# Patient Record
Sex: Female | Born: 1985 | Hispanic: Yes | Marital: Married | Smoking: Never smoker
Health system: Southern US, Community
[De-identification: ages and names within clinical notes are randomized; demographics above are authoritative.]

## PROBLEM LIST (undated history)

## (undated) ENCOUNTER — Inpatient Hospital Stay (HOSPITAL_COMMUNITY): Payer: Self-pay

## (undated) DIAGNOSIS — D219 Benign neoplasm of connective and other soft tissue, unspecified: Secondary | ICD-10-CM

## (undated) HISTORY — PX: BREAST SURGERY: SHX581

## (undated) HISTORY — PX: APPENDECTOMY: SHX54

---

## 2018-09-25 ENCOUNTER — Other Ambulatory Visit: Payer: Self-pay

## 2018-09-25 ENCOUNTER — Emergency Department (HOSPITAL_BASED_OUTPATIENT_CLINIC_OR_DEPARTMENT_OTHER)
Admission: EM | Admit: 2018-09-25 | Discharge: 2018-09-25 | Disposition: A | Discharge status: Home / Self Care | Payer: BLUE CROSS/BLUE SHIELD | Attending: Emergency Medicine | Admitting: Emergency Medicine

## 2018-09-25 ENCOUNTER — Emergency Department (HOSPITAL_BASED_OUTPATIENT_CLINIC_OR_DEPARTMENT_OTHER): Payer: BLUE CROSS/BLUE SHIELD

## 2018-09-25 ENCOUNTER — Encounter (HOSPITAL_BASED_OUTPATIENT_CLINIC_OR_DEPARTMENT_OTHER): Payer: Self-pay | Admitting: *Deleted

## 2018-09-25 DIAGNOSIS — R55 Syncope and collapse: Secondary | ICD-10-CM | POA: Diagnosis present

## 2018-09-25 HISTORY — DX: Benign neoplasm of connective and other soft tissue, unspecified: D21.9

## 2018-09-25 LAB — CBC WITH DIFFERENTIAL/PLATELET
Abs Immature Granulocytes: 0.02 10*3/uL (ref 0.00–0.07)
Basophils Absolute: 0.1 10*3/uL (ref 0.0–0.1)
Basophils Relative: 1 %
Eosinophils Absolute: 0.1 10*3/uL (ref 0.0–0.5)
Eosinophils Relative: 1 %
HEMATOCRIT: 44.1 % (ref 36.0–46.0)
HEMOGLOBIN: 13.8 g/dL (ref 12.0–15.0)
Immature Granulocytes: 0 %
Lymphocytes Relative: 30 %
Lymphs Abs: 2.8 10*3/uL (ref 0.7–4.0)
MCH: 29.4 pg (ref 26.0–34.0)
MCHC: 31.3 g/dL (ref 30.0–36.0)
MCV: 94 fL (ref 80.0–100.0)
MONO ABS: 0.6 10*3/uL (ref 0.1–1.0)
Monocytes Relative: 7 %
Neutro Abs: 6 10*3/uL (ref 1.7–7.7)
Neutrophils Relative %: 61 %
Platelets: 315 10*3/uL (ref 150–400)
RBC: 4.69 MIL/uL (ref 3.87–5.11)
RDW: 13.8 % (ref 11.5–15.5)
WBC: 9.6 10*3/uL (ref 4.0–10.5)
nRBC: 0 % (ref 0.0–0.2)

## 2018-09-25 LAB — BASIC METABOLIC PANEL
Anion gap: 5 (ref 5–15)
BUN: 19 mg/dL (ref 6–20)
CO2: 28 mmol/L (ref 22–32)
Calcium: 9.2 mg/dL (ref 8.9–10.3)
Chloride: 103 mmol/L (ref 98–111)
Creatinine, Ser: 0.99 mg/dL (ref 0.44–1.00)
GFR calc Af Amer: >60 mL/min (ref >60)
GFR calc non Af Amer: >60 mL/min (ref >60)
GLUCOSE: 87 mg/dL (ref 70–99)
Potassium: 3.9 mmol/L (ref 3.5–5.1)
Sodium: 136 mmol/L (ref 135–145)

## 2018-09-25 LAB — URINALYSIS, MICROSCOPIC (REFLEX)

## 2018-09-25 LAB — URINALYSIS, ROUTINE W REFLEX MICROSCOPIC
Bilirubin Urine: NEGATIVE
Glucose, UA: NEGATIVE mg/dL
Ketones, ur: NEGATIVE mg/dL
Leukocytes, UA: NEGATIVE
Nitrite: NEGATIVE
Protein, ur: NEGATIVE mg/dL
Specific Gravity, Urine: <1.005 — ABNORMAL LOW (ref 1.005–1.030)
pH: 6 (ref 5.0–8.0)

## 2018-09-25 LAB — D-DIMER, QUANTITATIVE: D-Dimer, Quant: 0.51 ug/mL-FEU — ABNORMAL HIGH (ref 0.00–0.50)

## 2018-09-25 LAB — PREGNANCY, URINE: PREG TEST UR: NEGATIVE

## 2018-09-25 MED ORDER — SODIUM CHLORIDE 0.9 % IV BOLUS
1000.0000 mL | Freq: Once | INTRAVENOUS | Status: AC
  Administered 2018-09-25: 1000 mL via INTRAVENOUS

## 2018-09-25 MED ORDER — IOPAMIDOL (ISOVUE-370) INJECTION 76%
100.0000 mL | Freq: Once | INTRAVENOUS | Status: AC | PRN
  Administered 2018-09-25: 100 mL via INTRAVENOUS

## 2018-09-25 NOTE — ED Triage Notes (Signed)
Husband states pt "passed out" this am while brushing hair,  'out for only seconds" reports heavy bleeding d/y fibroids x 5 days, sent here by PMD for eval

## 2018-09-25 NOTE — ED Notes (Signed)
Patient transported to CT 

## 2018-09-25 NOTE — Discharge Instructions (Signed)
As we discussed, make sure you are staying hydrated and drinking plenty of fluids.  Make sure you are getting plenty of rest.  Follow-up with your primary care doctor early next week.  Return the emergency department for any repeat syncopal episodes, chest pain, difficulty breathing, vomiting or any other worsening or concerning symptoms.

## 2018-09-25 NOTE — ED Provider Notes (Signed)
[facility] EMERGENCY DEPARTMENT Provider Note   CSN: [id number] Arrival date & time: 09/25/18  1506     History   Chief Complaint Chief Complaint  Patient presents with  . Loss of Consciousness    HPI Latoya Kennedy is a 33 y.o. female with no significant past medical history who presents for evaluation of syncope that occurred earlier today.  Patient reports that she was getting out of bed and went to the bathroom to go brush her hair.  Husband was in the bedroom when he heard patient fall.  He went to go check on patient and reported syncopal episode with LOC that lasted for about 3 to 4 seconds.  Husband reports that patient came to after about a few seconds.  She reports that prior to syncopal episode, she did not have any dizziness, chest pain, lightheadedness.  She does report that she felt slightly lightheaded [place].  She does report that she has been starting her LMP a week ago and noted heavy bleeding.  She does have a history of fibroids and does report that she usually has heavy bleeding.  Patient does report that she recently returned from a 12-hour trip to [place] on 09/14/18.  She has not had any leg swelling.  Patient reports she is on birth control pills.  Patient states that she feels better now but she called her primary care doctor and advised her to go to the emergency department for full evaluation.  She does report that she has some slight head pain from where she fell and landed on her head.  Patient denies any vision changes, numbness/weakness of arms or legs, chest pain, difficulty breathing.  The history is provided by the patient.    Past Medical History:  Diagnosis Date  . Fibroid     There are no active problems to display for this patient.   Past Surgical History:  Procedure Laterality Date  . APPENDECTOMY    . BREAST SURGERY       OB History   No obstetric history on file.      Home Medications    Prior to Admission medications     Not on File    Family History History reviewed. No pertinent family history.  Social History Social History   Tobacco Use  . Smoking status: Never Smoker  . Smokeless tobacco: Never Used  Substance Use Topics  . Alcohol use: Not Currently  . Drug use: Not Currently     Allergies   Patient has no known allergies.   Review of Systems Review of Systems  Constitutional: Negative for fever.  Eyes: Negative for visual disturbance.  Respiratory: Negative for cough and shortness of breath.   Cardiovascular: Negative for chest pain.  Gastrointestinal: Negative for abdominal pain, nausea and vomiting.  Genitourinary: Negative for dysuria and hematuria.  Musculoskeletal: Positive for neck pain.  Neurological: Positive for syncope, light-headedness and headaches. Negative for weakness and numbness.  All other systems reviewed and are negative.    Physical Exam Updated Vital Signs BP 121/67 (BP Location: Right Arm)   Pulse 67   Temp 98.5 F (36.9 C) (Oral)   Resp (!) 22   Ht 5\' 10"  (1.778 m)   Wt 84.8 kg   LMP 09/18/2018   SpO2 100%   BMI 26.83 kg/m   Physical Exam Vitals signs and nursing note reviewed.  Constitutional:      Appearance: Normal appearance. She is well-developed.  HENT:     Head: Normocephalic  and atraumatic.   Eyes:     General: Lids are normal.     Extraocular Movements: Extraocular movements intact.     Conjunctiva/sclera: Conjunctivae normal.     Pupils: Pupils are equal, round, and reactive to light.     Comments: EOMs intact. PERRL.   Neck:     Musculoskeletal: Full passive range of motion without pain. Muscular tenderness present. No spinous process tenderness.      Comments: Full flexion/extension and lateral movement of neck fully intact. No bony midline tenderness.  Muscular tenderness noted to the paraspinal muscles of the cervical region.  No deformities or crepitus.  Cardiovascular:     Rate and Rhythm: Normal rate and regular  rhythm.     Pulses: Normal pulses.          Radial pulses are 2+ on the right side and 2+ on the left side.     Heart sounds: Normal heart sounds. No murmur. No friction rub. No gallop.   Pulmonary:     Effort: Pulmonary effort is normal.     Breath sounds: Normal breath sounds.     Comments: Lungs clear to auscultation bilaterally.  Symmetric chest rise.  No wheezing, rales, rhonchi. Abdominal:     Palpations: Abdomen is soft. Abdomen is not rigid.     Tenderness: There is no abdominal tenderness. There is no guarding.  Musculoskeletal: Normal range of motion.     Comments: Bilateral lower extremities are symmetric in appearance without any overlying warmth, erythema, edema.  Skin:    General: Skin is warm and dry.     Capillary Refill: Capillary refill takes less than 2 seconds.  Neurological:     Mental Status: She is alert and oriented to person, place, and time.     Comments: Cranial nerves III-XII intact Follows commands, Moves all extremities  5/5 strength to BUE and BLE  Sensation intact throughout all major nerve distributions Normal coordination No slurred speech. No facial droop.   Psychiatric:        Speech: Speech normal.      ED Treatments / Results  Labs (all labs ordered are listed, but only abnormal results are displayed) Labs Reviewed  D-DIMER, QUANTITATIVE (NOT AT [facility]) - Abnormal; Notable for the following components:      Result Value   D-Dimer, Quant 0.51 (*)    All other components within normal limits  URINALYSIS, ROUTINE W REFLEX MICROSCOPIC - Abnormal; Notable for the following components:   Specific Gravity, Urine <1.005 (*)    Hgb urine dipstick TRACE (*)    All other components within normal limits  URINALYSIS, MICROSCOPIC (REFLEX) - Abnormal; Notable for the following components:   Bacteria, UA RARE (*)    All other components within normal limits  URINE CULTURE  BASIC METABOLIC PANEL  CBC WITH DIFFERENTIAL/PLATELET  PREGNANCY, URINE     EKG EKG Interpretation  Date/Time:  Thursday September 25 2018 15:35:54 EST Ventricular Rate:  62 PR Interval:    QRS Duration: 87 QT Interval:  411 QTC Calculation: 418 R Axis:   60 Text Interpretation:  Sinus rhythm Borderline short PR interval Confirmed by Gerlene Fee [id number]) on 09/25/2018 4:30:18 PM   Radiology Dg Chest 2 View  Result Date: 09/25/2018 CLINICAL DATA:  Syncope this morning EXAM: CHEST - 2 VIEW COMPARISON:  None FINDINGS: Normal heart size, mediastinal contours, and pulmonary vascularity. Lungs clear. No pleural effusion or pneumothorax. Bones unremarkable. IMPRESSION: Normal exam. Electronically Signed   By: Lavonia Dana  M.D.   On: 09/25/2018 16:12   Ct Head Wo Contrast  Result Date: 09/25/2018 CLINICAL DATA:  Syncopal episode today. Hit top of head. EXAM: CT HEAD WITHOUT CONTRAST TECHNIQUE: Contiguous axial images were obtained from the base of the skull through the vertex without intravenous contrast. COMPARISON:  None. FINDINGS: Brain: There is no evidence of acute infarct, intracranial hemorrhage, mass, midline shift, or extra-axial fluid collection. The ventricles and sulci are normal. Vascular: No hyperdense vessel. Skull: No fracture or focal osseous lesion. Sinuses/Orbits: Mild right anterior ethmoid air cell mucosal thickening. Clear mastoid air cells. Unremarkable orbits. Other: None. IMPRESSION: Negative head CT. Electronically Signed   By: Logan Bores M.D.   On: 09/25/2018 16:16   Ct Angio Chest Pe W And/or Wo Contrast  Result Date: 09/25/2018 CLINICAL DATA:  Syncopal episode today. Elevated D-dimer. EXAM: CT ANGIOGRAPHY CHEST WITH CONTRAST TECHNIQUE: Multidetector CT imaging of the chest was performed using the standard protocol during bolus administration of intravenous contrast. Multiplanar CT image reconstructions and MIPs were obtained to evaluate the vascular anatomy. CONTRAST:  153mL ISOVUE-370 IOPAMIDOL (ISOVUE-370) INJECTION 76% COMPARISON:  Chest  radiographs obtained earlier today. FINDINGS: Cardiovascular: Satisfactory opacification of the pulmonary arteries to the segmental level. No evidence of pulmonary embolism. Normal heart size. No pericardial effusion. Mediastinum/Nodes: No enlarged mediastinal, hilar, or axillary lymph nodes. Thyroid gland, trachea, and esophagus demonstrate no significant findings. Lungs/Pleura: Lungs are clear. No pleural effusion or pneumothorax. Upper Abdomen: Unremarkable. Musculoskeletal: Mild thoracic spine degenerative changes. Bilateral retropectoral breast implants. Review of the MIP images confirms the above findings. IMPRESSION: No pulmonary emboli or acute abnormality. Electronically Signed   By: Claudie Revering M.D.   On: 09/25/2018 17:31    Procedures Procedures (including critical care time)  Medications Ordered in ED Medications  sodium chloride 0.9 % bolus 1,000 mL ( Intravenous Stopped 09/25/18 1655)  iopamidol (ISOVUE-370) 76 % injection 100 mL (100 mLs Intravenous Contrast Given 09/25/18 1649)     Initial Impression / Assessment and Plan / ED Course  I have reviewed the triage vital signs and the nursing notes.  Pertinent labs & imaging results that were available during my care of the patient were reviewed by me and considered in my medical decision making (see chart for details).     33 year old female who presents for evaluation of syncopal episode that occurred earlier today.  Patient reports that she got out of bed and went to go brush her hair and had a syncopal episode.  Husband reports she had a positive LOC for about 5 seconds and then returned back to baseline.  Patient denies any preceding chest pain, dizziness, lightheadedness.  She does report that she has had some heavy vaginal bleeding over the last several days consistent with her menstrual cycle.  Additionally, patient reports she recently had a 12-hour trip to [place].  Patient denies any chest pain, difficulty breathing, leg  swelling. Patient is afebrile, non-toxic appearing, sitting comfortably on examination table. Vital signs reviewed and stable.  No neurodeficits noted on exam.  We will plan to check labs, EKG, chest x-ray.  Additionally, patient reports hitting her head and reports tenderness palpation of the parietal scalp.  Low suspicion for skull fracture or intracranial hemorrhage but will check CT of head.  Additionally, given lack of prodrome and recent 12-hour trip, will check d-dimer.  CBC without any significant leukocytosis or anemia. D-dimer is positive. Will plan for CTA of chest. BMP unremarkable.  UA negative.  Urine pregnancy negative.  Head CT negative.  CTA of chest shows no evidence of acute pulmonary emboli.  No evidence of any acute abnormality.  Discussed results with patient and family.  Patient states feeling fine here in the ED.  She is able to ambulate without any difficulty and does not feel any lightheadedness or dizziness.  Vital signs stable. [facility] syncope rules evaluated.  Patient has no positive findings.  She is low risk for serious outcome. At this time, patient exhibits no emergent life-threatening condition that require further evaluation in ED or admission. Discussed patient with Dr. Sedonia Small who is agreeable. Patient had ample opportunity for questions and discussion. All patient's questions were answered with full understanding. Strict return precautions discussed. Patient expresses understanding and agreement to plan.    Portions of this note were generated with [employer]. Dictation errors may occur despite best attempts at proofreading.    Final Clinical Impressions(s) / ED Diagnoses   Final diagnoses:  Syncope, unspecified syncope type    ED Discharge Orders    None       Desma Mcgregor 09/25/18 1923    Maudie Flakes, MD 09/26/18 2344

## 2018-09-26 LAB — URINE CULTURE: Culture: NO GROWTH

## 2020-08-01 ENCOUNTER — Emergency Department (HOSPITAL_BASED_OUTPATIENT_CLINIC_OR_DEPARTMENT_OTHER)
Admission: EM | Admit: 2020-08-01 | Discharge: 2020-08-01 | Disposition: A | Discharge status: Home / Self Care | Payer: 59 | Attending: Emergency Medicine | Admitting: Emergency Medicine

## 2020-08-01 ENCOUNTER — Other Ambulatory Visit: Payer: Self-pay

## 2020-08-01 ENCOUNTER — Emergency Department (HOSPITAL_BASED_OUTPATIENT_CLINIC_OR_DEPARTMENT_OTHER): Payer: 59

## 2020-08-01 DIAGNOSIS — I82401 Acute embolism and thrombosis of unspecified deep veins of right lower extremity: Secondary | ICD-10-CM | POA: Insufficient documentation

## 2020-08-01 DIAGNOSIS — M79604 Pain in right leg: Secondary | ICD-10-CM | POA: Diagnosis present

## 2020-08-01 DIAGNOSIS — I824Y1 Acute embolism and thrombosis of unspecified deep veins of right proximal lower extremity: Secondary | ICD-10-CM

## 2020-08-01 MED ORDER — APIXABAN (ELIQUIS) EDUCATION KIT FOR DVT/PE PATIENTS: PACK | Freq: Once | Status: AC

## 2020-08-01 MED ORDER — APIXABAN (ELIQUIS) VTE STARTER PACK (10MG AND 5MG): ORAL_TABLET | ORAL | 0 refills | Status: DC

## 2020-08-01 NOTE — ED Provider Notes (Signed)
[facility] EMERGENCY DEPARTMENT Provider Note   CSN: [id number] Arrival date & time: 08/01/20  1153     History Chief Complaint  Patient presents with  . Leg Pain    Latoya Kennedy is a 34 y.o. female. Patient speaks Spanish primarily and was translated using computer device.  HPI Patient presents after being sent in by physical therapy for possible DVT.  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  Beginning on Friday began to have more pain.  Pain goes from the middle proximal thigh down to the ankle.  Also had increased swelling.  States she had been doing well before this.  No chest pain or trouble breathing.  No fevers.  States she has been doing well in physical therapy.  No previous history of blood clots.    Past Medical History:  Diagnosis Date  . Fibroid     There are no problems to display for this patient.   Past Surgical History:  Procedure Laterality Date  . APPENDECTOMY    . BREAST SURGERY       OB History   No obstetric history on file.     No family history on file.  Social History   Tobacco Use  . Smoking status: Never Smoker  . Smokeless tobacco: Never Used  Vaping Use  . Vaping Use: Never used  Substance Use Topics  . Alcohol use: Not Currently  . Drug use: Not Currently    Home Medications Prior to Admission medications   Medication Sig Start Date End Date Taking? Authorizing Provider  APIXABAN (ELIQUIS) VTE STARTER PACK (10MG AND 5MG) Take as directed on package: start with two-24m tablets twice daily for 7 days. On day 8, switch to one-593mtablet twice daily. 08/01/20   PiDavonna BellingMD    Allergies    Patient has no known allergies.  Review of Systems   Review of Systems  Constitutional: Negative for appetite change and fever.  HENT: Negative for congestion.   Respiratory: Negative for shortness of breath.   Cardiovascular: Positive for leg swelling. Negative for chest pain.  Gastrointestinal: Negative  for abdominal pain.  Genitourinary: Negative for flank pain.  Musculoskeletal: Negative for back pain.  Skin: Negative for rash.  Neurological: Negative for weakness.  Psychiatric/Behavioral: Negative for confusion.    Physical Exam Updated Vital Signs BP 108/74 (BP Location: Right Arm)   Pulse 72   Temp 98.5 F (36.9 C) (Oral)   Resp 16   Ht 5' 9" (1.753 m)   Wt 90.7 kg   LMP 07/20/2020   SpO2 100%   BMI 29.53 kg/m   Physical Exam Vitals and nursing note reviewed.  HENT:     Head: Atraumatic.     Mouth/Throat:     Mouth: Mucous membranes are moist.  Eyes:     Pupils: Pupils are equal, round, and reactive to light.  Cardiovascular:     Rate and Rhythm: Regular rhythm.  Pulmonary:     Breath sounds: No stridor. No wheezing or rhonchi.  Chest:     Chest wall: No tenderness.  Abdominal:     Tenderness: There is no abdominal tenderness.  Musculoskeletal:     Cervical back: Neck supple.     Comments: Tenderness on right lower extremity.  Tenderness from medial thigh down to calf.  Knee surgical sites appear to be healing well.  No erythema.  Pulse intact distally.  Skin:    Capillary Refill: Capillary refill takes less than 2  seconds.  Neurological:     Mental Status: She is alert and oriented to person, place, and time.  Psychiatric:        Mood and Affect: Mood normal.     ED Results / Procedures / Treatments   Labs (all labs ordered are listed, but only abnormal results are displayed) Labs Reviewed - No data to display  EKG None  Radiology US Venous Img Lower Unilateral Right  Result Date: 08/01/2020 CLINICAL DATA:  Recent ACL surgery. EXAM: RIGHT LOWER EXTREMITY VENOUS DOPPLER ULTRASOUND TECHNIQUE: Gray-scale sonography with compression, as well as color and duplex ultrasound, were performed to evaluate the deep venous system(s) from the level of the common femoral vein through the popliteal and proximal calf veins. COMPARISON:  None. FINDINGS: VENOUS  There is evidence of thrombus within the right common femoral vein, profundus femoral vein, femoral vein, popliteal vein, posterior tibial vein, peroneal vein and gastrocnemius vein. No evidence of thrombus at the right saphenofemoral junction. Limited views of the contralateral common femoral vein are unremarkable. IMPRESSION: Positive for extensive DVT in the right lower extremity, as detailed above. Findings discussed with Dr. Alvino Chapel via telephone at 2:05 p.m. Electronically Signed   By: Margaretha Sheffield MD   On: 08/01/2020 14:07    Procedures Procedures (including critical care time)  Medications Ordered in ED Medications  apixaban [facility]) Education Kit for DVT/PE patients (has no administration in time range)    ED Course  I have reviewed the triage vital signs and the nursing notes.  Pertinent labs & imaging results that were available during my care of the patient were reviewed by me and considered in my medical decision making (see chart for details).    MDM Rules/Calculators/A&P                          Patient sent in to rule out DVT.  Does have large DVT on the right.  Discussed with radiology.  I cannot necessarily see the proximal aspect of the clot.  No skin changes.  Will start on Eliquis.  Due to large and proximal DVT will have patient follow-up with vascular surgery.  No signs or symptoms of pulmonary embolism at this time.  Will discharge home. Final Clinical Impression(s) / ED Diagnoses Final diagnoses:  Acute deep vein thrombosis (DVT) of proximal vein of right lower extremity ([place])    Rx / DC Orders ED Discharge Orders         Ordered    APIXABAN (ELIQUIS) VTE STARTER PACK (10MG AND 5MG)        08/01/20 1422           Davonna Belling, MD 08/01/20 1430

## 2020-08-01 NOTE — ED Triage Notes (Signed)
Pt had ACL surgery to right leg on 11/1, went to physical therapy today who recommended to do ultrasound. Pt started having pain throughout leg since Friday.

## 2020-08-01 NOTE — Discharge Instructions (Signed)
Call the vascular surgeons about follow-up for your extensive right leg DVT.  Watch for difficulty breathing.

## 2020-11-14 IMAGING — CT CT HEAD W/O CM
3 series · 16 of 47 positions shown, 19 images · non-contrast
Comparison: None.

CLINICAL DATA: Syncopal episode today. Hit top of head.

EXAM:
CT HEAD WITHOUT CONTRAST
TECHNIQUE: Contiguous axial images were obtained from the base of the skull
through the vertex without intravenous contrast.

[Series 2: head wo · axial · 0.47mm/px · z∈[+1148,+1288]mm · 10 of 34 slices shown, 13 images]
[im 3/34  brain]
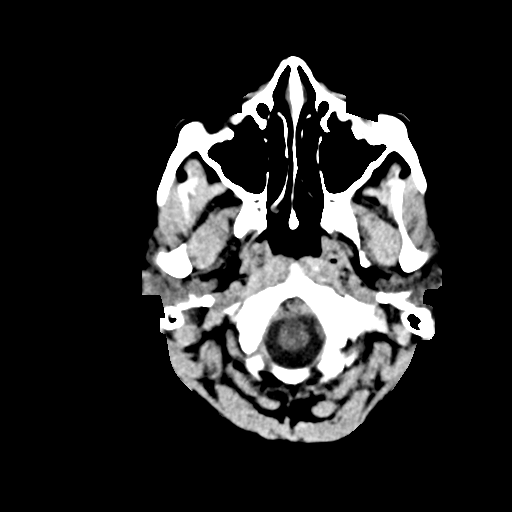
[im 3/34  bone]
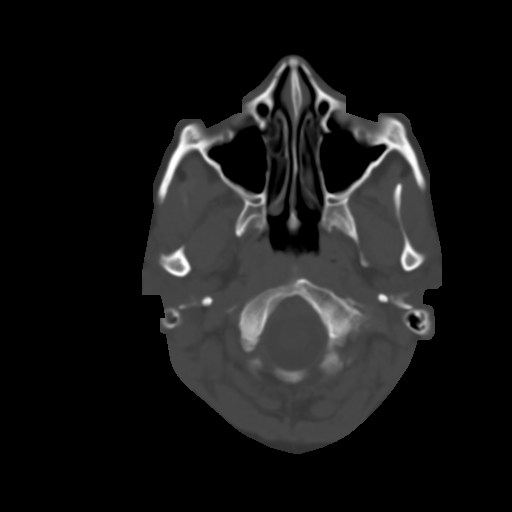
[im 6/34  brain]
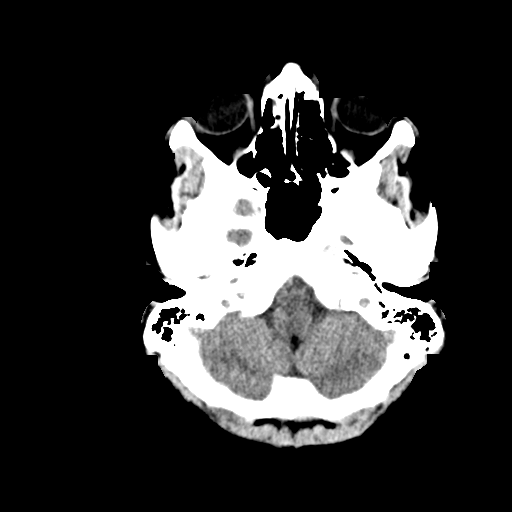
[im 10/34  brain]
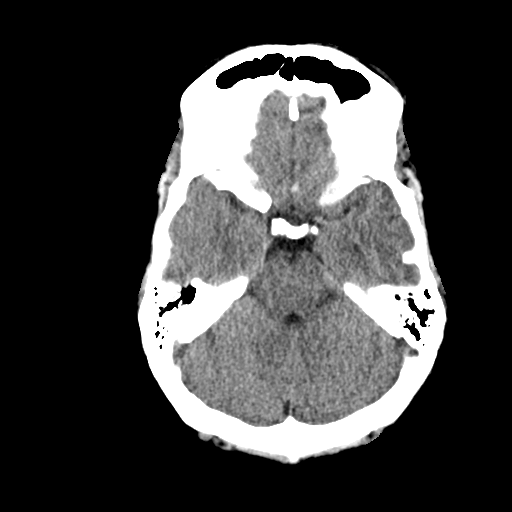
[im 12/34  brain]
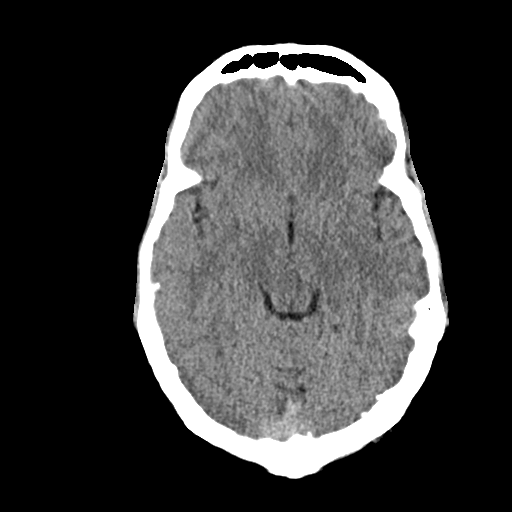
[im 15/34  brain]
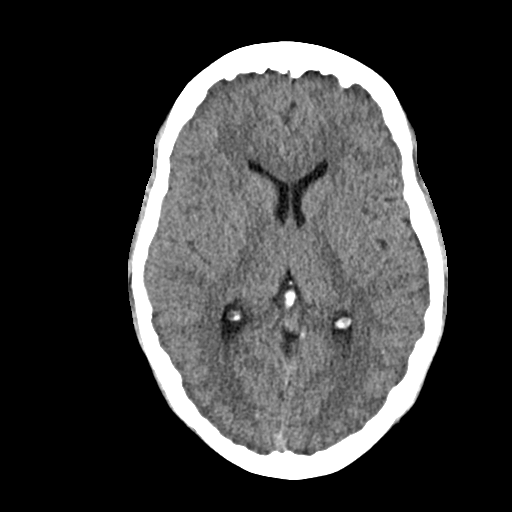
[im 15/34  bone]
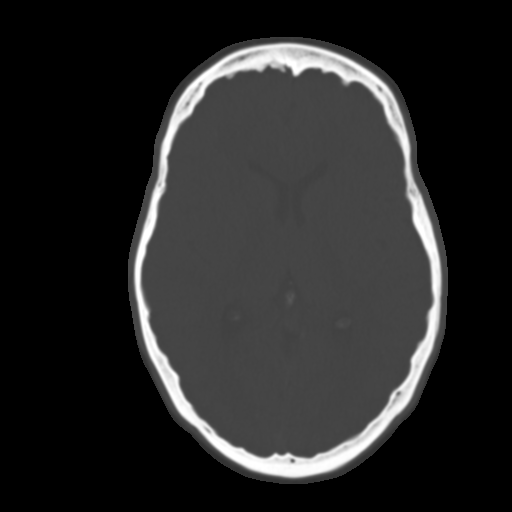
[im 19/34  brain]
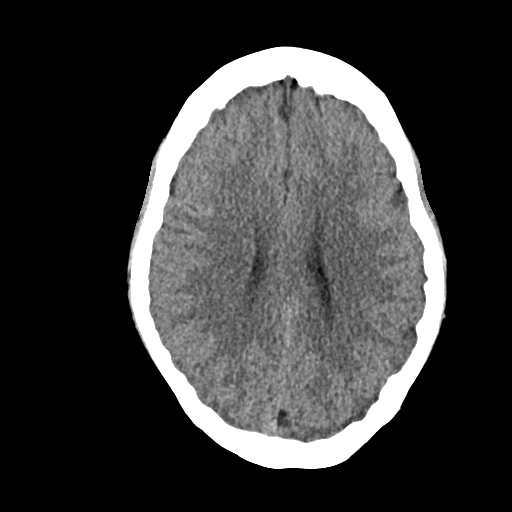
[im 22/34  brain]
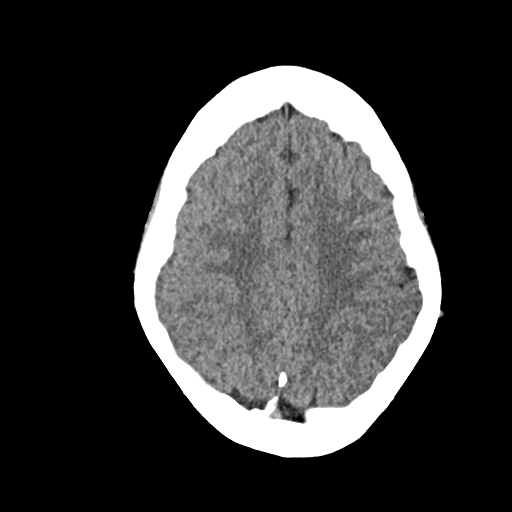
[im 26/34  brain]
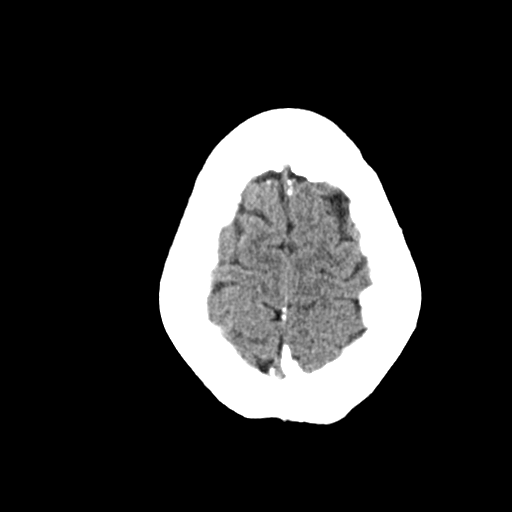
[im 28/34  brain]
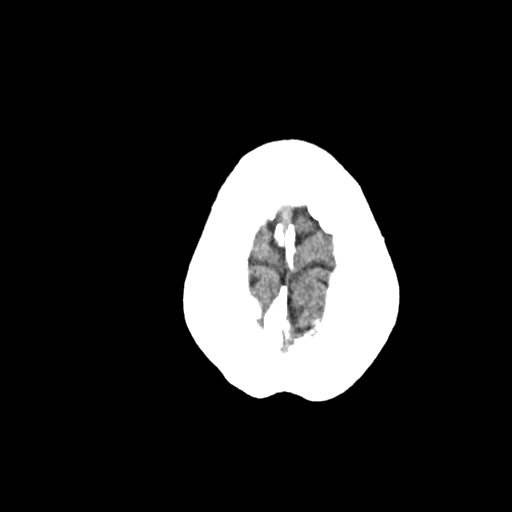
[im 28/34  bone]
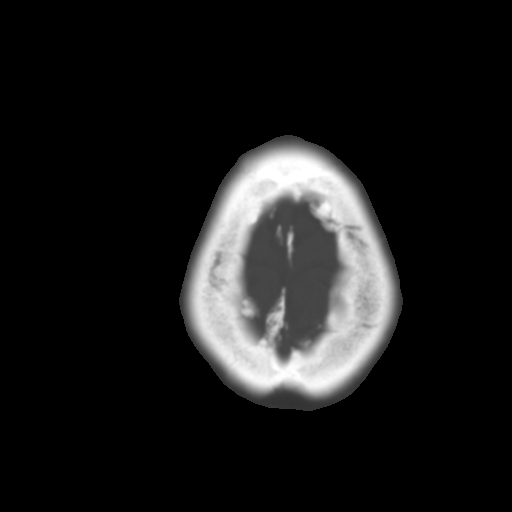
[im 31/34  brain]
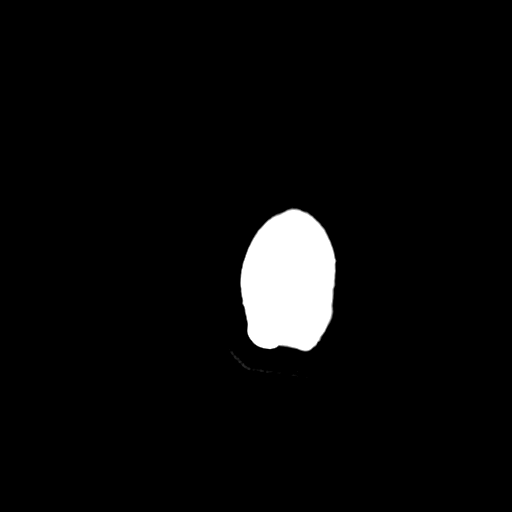

[Series 4: coronal soft · coronal · 0.34mm/px · 3 of 74 slices shown]
[im 25/74  brain]
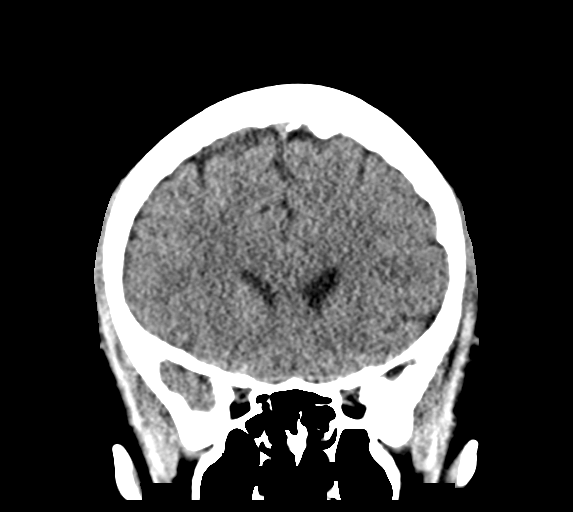
[im 33/74  brain]
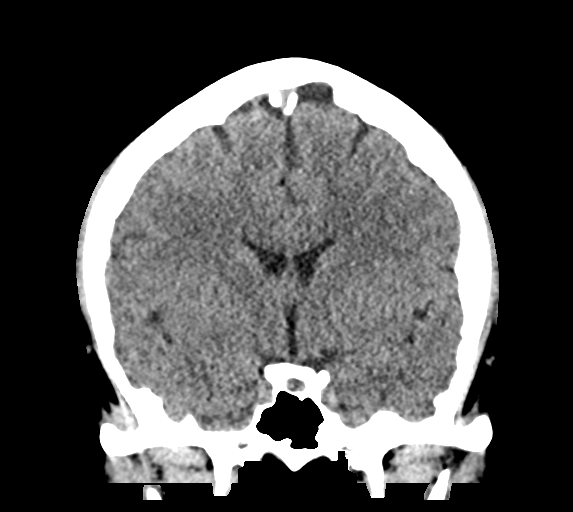
[im 41/74  brain]
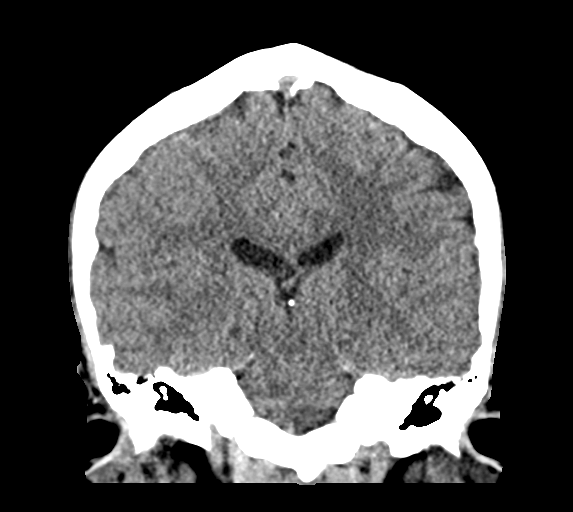

[Series 5: sag soft · sagittal · 0.34mm/px · 3 of 66 slices shown]
[im 22/66  brain]
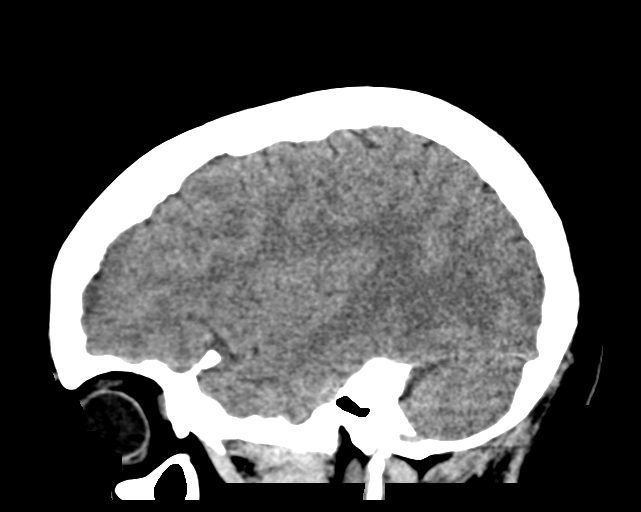
[im 33/66  brain]
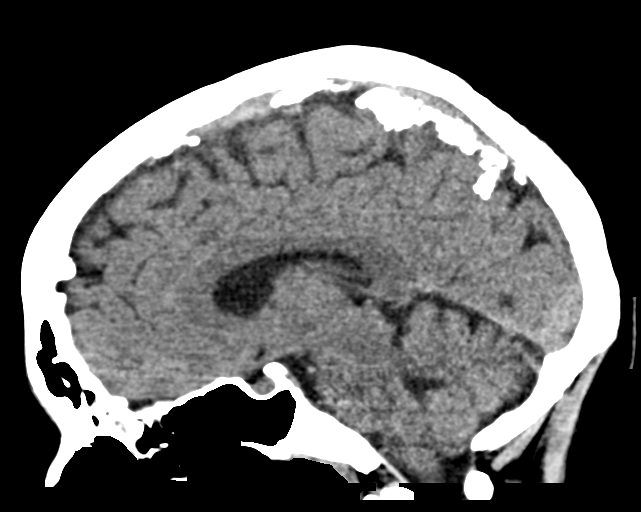
[im 44/66  brain]
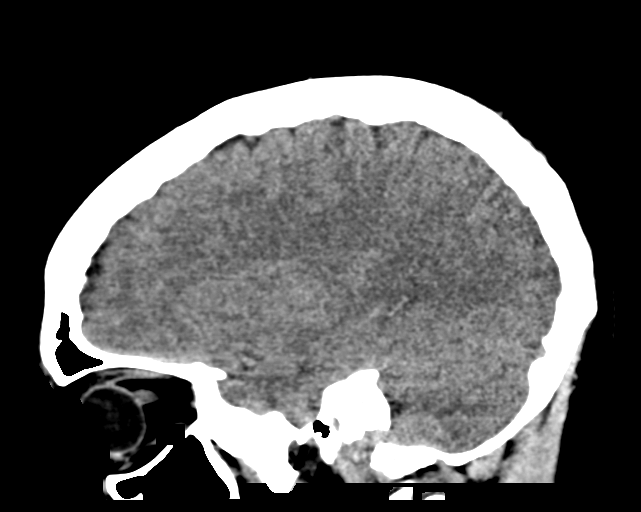

[16 of 47 positions shown; findings below may reference images not displayed]

FINDINGS: Brain: There is no evidence of acute infarct, intracranial
hemorrhage, mass, midline shift, or extra-axial fluid collection.
The ventricles and sulci are normal.

Vascular: No hyperdense vessel.

Skull: No fracture or focal osseous lesion.

Sinuses/Orbits: Mild right anterior ethmoid air cell mucosal
thickening. Clear mastoid air cells. Unremarkable orbits.

Other: None.
IMPRESSION: Negative head CT.

## 2020-11-23 ENCOUNTER — Telehealth: Payer: Self-pay | Admitting: *Deleted

## 2020-11-23 NOTE — Telephone Encounter (Signed)
Called and gave patient's fiance upcoming appointments - mailed calendar with welcome packet

## 2020-12-23 ENCOUNTER — Inpatient Hospital Stay: Payer: 59 | Attending: Family

## 2020-12-23 ENCOUNTER — Other Ambulatory Visit: Payer: Self-pay

## 2020-12-23 ENCOUNTER — Encounter: Payer: Self-pay | Admitting: Family

## 2020-12-23 ENCOUNTER — Inpatient Hospital Stay (HOSPITAL_BASED_OUTPATIENT_CLINIC_OR_DEPARTMENT_OTHER): Payer: 59 | Admitting: Family

## 2020-12-23 ENCOUNTER — Other Ambulatory Visit: Payer: Self-pay | Admitting: Family

## 2020-12-23 VITALS — BP 111/64 | HR 87 | Temp 98.2°F | Resp 16 | Ht 71.0 in | Wt 213.0 lb

## 2020-12-23 DIAGNOSIS — D6859 Other primary thrombophilia: Secondary | ICD-10-CM

## 2020-12-23 DIAGNOSIS — Z7901 Long term (current) use of anticoagulants: Secondary | ICD-10-CM

## 2020-12-23 DIAGNOSIS — I82411 Acute embolism and thrombosis of right femoral vein: Secondary | ICD-10-CM | POA: Diagnosis present

## 2020-12-23 DIAGNOSIS — I82441 Acute embolism and thrombosis of right tibial vein: Secondary | ICD-10-CM

## 2020-12-23 DIAGNOSIS — I824Z9 Acute embolism and thrombosis of unspecified deep veins of unspecified distal lower extremity: Secondary | ICD-10-CM

## 2020-12-23 DIAGNOSIS — I825Y1 Chronic embolism and thrombosis of unspecified deep veins of right proximal lower extremity: Secondary | ICD-10-CM

## 2020-12-23 LAB — CMP (CANCER CENTER ONLY)
ALT: 13 U/L (ref 0–44)
AST: 18 U/L (ref 15–41)
Albumin: 3.9 g/dL (ref 3.5–5.0)
Alkaline Phosphatase: 60 U/L (ref 38–126)
Anion gap: 6 (ref 5–15)
BUN: 13 mg/dL (ref 6–20)
CO2: 28 mmol/L (ref 22–32)
Calcium: 9.6 mg/dL (ref 8.9–10.3)
Chloride: 103 mmol/L (ref 98–111)
Creatinine: 0.84 mg/dL (ref 0.44–1.00)
GFR, Estimated: >60 mL/min (ref >60)
Glucose, Bld: 88 mg/dL (ref 70–99)
Potassium: 3.9 mmol/L (ref 3.5–5.1)
Sodium: 137 mmol/L (ref 135–145)
Total Bilirubin: 0.3 mg/dL (ref 0.3–1.2)
Total Protein: 7.1 g/dL (ref 6.5–8.1)

## 2020-12-23 LAB — CBC WITH DIFFERENTIAL (CANCER CENTER ONLY)
Abs Immature Granulocytes: 0.01 10*3/uL (ref 0.00–0.07)
Basophils Absolute: 0.1 10*3/uL (ref 0.0–0.1)
Basophils Relative: 1 %
Eosinophils Absolute: 0.1 10*3/uL (ref 0.0–0.5)
Eosinophils Relative: 1 %
HCT: 40.1 % (ref 36.0–46.0)
Hemoglobin: 13.2 g/dL (ref 12.0–15.0)
Immature Granulocytes: 0 %
Lymphocytes Relative: 29 %
Lymphs Abs: 2.4 10*3/uL (ref 0.7–4.0)
MCH: 29.4 pg (ref 26.0–34.0)
MCHC: 32.9 g/dL (ref 30.0–36.0)
MCV: 89.3 fL (ref 80.0–100.0)
Monocytes Absolute: 0.7 10*3/uL (ref 0.1–1.0)
Monocytes Relative: 9 %
Neutro Abs: 5 10*3/uL (ref 1.7–7.7)
Neutrophils Relative %: 60 %
Platelet Count: 271 10*3/uL (ref 150–400)
RBC: 4.49 MIL/uL (ref 3.87–5.11)
RDW: 13.9 % (ref 11.5–15.5)
WBC Count: 8.3 10*3/uL (ref 4.0–10.5)
nRBC: 0 % (ref 0.0–0.2)

## 2020-12-23 LAB — D-DIMER, QUANTITATIVE: D-Dimer, Quant: 0.67 ug/mL-FEU — ABNORMAL HIGH (ref 0.00–0.50)

## 2020-12-23 LAB — ANTITHROMBIN III: AntiThromb III Func: 90 % (ref 75–120)

## 2020-12-23 NOTE — Progress Notes (Signed)
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   HISTORY OF PRESENT ILLNESS: Ms. Coatney is a very pleasant 35 yo Hispanic female with diagnosis of extensive DVT in the right lower extremity involving the right common femoral vein profundus femoral vein, femoral vein, popliteal vein, posterior tibial vein, peroneal vein and gastrocnemius vein. No prior history of clot.  This was diagnosed after she had surgery on 07/11/2020 [facility], [facility]) for right knee arthroscopic anterior cruciate ligament reconstruction with hamstring allograft.  She had to cut PT short due to the thrombus and will have to have another surgery for manipulation and lysis of adhesions to improve mobility and range of motion at a later date. She is having stiffness and is not able to completely extend the right leg.  She had developed pain and swelling in the left lag post surgery and was diagnosed with DVT on 08/01/21. She started treatment with Eliquis that day and continues to tolerate well. She states that she is taking the proper dose PO BID as prescribed.  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  No episodes of bleeding. No petechiae. She does bruise easily but not in excess.  She has also remained on Tri Sprintec birth control which contains estrogen. She has been in contact with her PCP and they are working to change over to something only containing progestin. She  is aware that she will need to now avoid estrogen based therapy of any kind due to the increased risk of clot.  She has no history of pregnancy or miscarriage.  She states that she does have history of uterine fibroids. Her cycle is regular with heavy flow at times. She also note cramping with her cycle.  No family history of thrombotic event.  She has had surgery in the past including appendectomy as well as breast augmentations in 2009, without any complications.  No history of diabetes or thyroid disease.  No fever, chills, n/v, cough, rash, dizziness, SOB, chest pain, palpitations, abdominal pain or changes in bowel or bladder habits.  She still has some mild swelling in the right knee. No other swelling noted. No numbness or tingling in her extremities.  No falls to report. She has not had a syncopal episode for around 2 years.  No smoking, ETOh or recreational drug use.  Sh has maintained a good appetite and is staying well hydrated. Her weight is described as stable at 213 lbs.   ROS: All other 10 point review of systems is negative.   PAST MEDICAL HISTORY:   Past Medical History:  Diagnosis Date  . Fibroid     ALLERGIES: No Known Allergies    MEDICATIONS:  Current Outpatient Medications on File Prior to Visit  Medication Sig Dispense Refill  . APIXABAN (ELIQUIS) VTE STARTER PACK (10MG  AND 5MG ) Take as directed on package: start with two-5mg  tablets twice daily for 7 days. On day 8, switch to one-5mg  tablet twice daily. 1 each 0   No  current facility-administered medications on file prior to visit.     PAST SURGICAL HISTORY Past Surgical History:  Procedure Laterality Date  . APPENDECTOMY    . BREAST SURGERY      FAMILY HISTORY: History reviewed. No pertinent family history.  SOCIAL HISTORY:  reports that she has never smoked. She has never used smokeless tobacco. She reports previous alcohol use. She reports previous drug use.  PERFORMANCE STATUS: The patient's  performance status is 1 - Symptomatic but completely ambulatory  PHYSICAL EXAM: Most Recent Vital Signs: Blood pressure 111/64, pulse 87, temperature 98.2 F (36.8 C), resp. rate 16, weight 213 lb (96.6 kg), SpO2 99 %. BP 111/64 (Patient Position: Sitting)   Pulse 87   Temp 98.2 F (36.8 C)   Resp 16   Ht 5\' 11"  (1.803 m)   Wt 213 lb (96.6 kg)   SpO2 99%   BMI 29.71 kg/m   General Appearance:    Alert, cooperative, no distress, appears stated age  Head:    Normocephalic, without obvious abnormality, atraumatic  Eyes:    PERRL, conjunctiva/corneas clear, EOM's intact, fundi    benign, both eyes        Throat:   Lips, mucosa, and tongue normal; teeth and gums normal  Neck:   Supple, symmetrical, trachea midline, no adenopathy;    thyroid:  no enlargement/tenderness/nodules; no carotid   bruit or JVD  Back:     Symmetric, no curvature, ROM normal, no CVA tenderness  Lungs:     Clear to auscultation bilaterally, respirations unlabored  Chest Wall:    No tenderness or deformity   Heart:    Regular rate and rhythm, S1 and S2 normal, no murmur, rub   or gallop     Abdomen:     Soft, non-tender, bowel sounds active all four quadrants,    no masses, no organomegaly        Extremities:   Extremities normal, atraumatic, no cyanosis or edema  Pulses:   2+ and symmetric all extremities  Skin:   Skin color, texture, turgor normal, no rashes or lesions  Lymph nodes:   Cervical, supraclavicular, and axillary nodes normal  Neurologic:   CNII-XII intact, normal strength, sensation and reflexes    throughout    LABORATORY DATA:  Results for orders placed or performed in visit on 12/23/20 (from the past 48 hour(s))  CBC with Differential (Cancer Center Only)     Status: None   Collection Time: 12/23/20  2:35 PM  Result Value Ref Range   WBC Count 8.3 4.0 - 10.5 K/uL   RBC 4.49 3.87 - 5.11 MIL/uL   Hemoglobin 13.2 12.0 - 15.0 g/dL   HCT 40.1 36.0 - 46.0 %   MCV 89.3 80.0 - 100.0 fL    MCH 29.4 26.0 - 34.0 pg   MCHC 32.9 30.0 - 36.0 g/dL   RDW 13.9 11.5 - 15.5 %   Platelet Count 271 150 - 400 K/uL   nRBC 0.0 0.0 - 0.2 %   Neutrophils Relative % 60 %   Neutro Abs 5.0 1.7 - 7.7 K/uL   Lymphocytes Relative 29 %   Lymphs Abs 2.4 0.7 - 4.0 K/uL   Monocytes Relative 9 %   Monocytes Absolute 0.7 0.1 - 1.0 K/uL   Eosinophils Relative 1 %   Eosinophils Absolute 0.1 0.0 - 0.5 K/uL   Basophils Relative 1 %   Basophils Absolute 0.1 0.0 - 0.1 K/uL   Immature Granulocytes 0 %  Abs Immature Granulocytes 0.01 0.00 - 0.07 K/uL    Comment: Performed at [facility] at [facility], [address] [place], [place] [address]      RADIOGRAPHY: No results found.     PATHOLOGY: None  ASSESSMENT/PLAN: Ms. 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 This occurred post surgery and while on estrogen based birth control.  She was diagnosed in November, 2021 and has been on Eliquis since that time.  We will have her stop her estrogen based birth control immediately. She has been in contact with her gynecologist already to change to progesterone based.  Hyper coag panel is pending. We will see if she has any underlying reason to clot.   We will plan to see her again for follow-up in 3 months and repeat an [place] of her right leg again at that time.  We will call her as soon as we have all her lab results and go over.   All questions were answered and she is in agreement with the plan. She can contact our office with any questions or concerns. We can certainly see the patient sooner if needed.   The patient was discussed with Dr. Marin Olp and he is in agreement with the aforementioned.   Laverna Peace, NP

## 2020-12-24 LAB — PROTEIN C ACTIVITY: Protein C Activity: 126 % (ref 73–180)

## 2020-12-24 LAB — LUPUS ANTICOAGULANT PANEL
DRVVT: 32 s (ref 0.0–47.0)
PTT Lupus Anticoagulant: 31.2 s (ref 0.0–51.9)

## 2020-12-24 LAB — PROTEIN S, TOTAL: Protein S Ag, Total: 51 % — ABNORMAL LOW (ref 60–150)

## 2020-12-24 LAB — PROTEIN S ACTIVITY: Protein S Activity: 52 % — ABNORMAL LOW (ref 63–140)

## 2020-12-24 LAB — PROTEIN C, TOTAL: Protein C, Total: 87 % (ref 60–150)

## 2020-12-26 LAB — HOMOCYSTEINE: Homocysteine: 9 umol/L (ref 0.0–14.5)

## 2020-12-26 LAB — BETA-2-GLYCOPROTEIN I ABS, IGG/M/A
Beta-2 Glyco I IgG: <9 GPI IgG units (ref 0–20)
Beta-2-Glycoprotein I IgA: <9 GPI IgA units (ref 0–25)
Beta-2-Glycoprotein I IgM: <9 GPI IgM units (ref 0–32)

## 2020-12-27 LAB — CARDIOLIPIN ANTIBODIES, IGG, IGM, IGA
Anticardiolipin IgA: <9 APL U/mL (ref 0–11)
Anticardiolipin IgG: <9 GPL U/mL (ref 0–14)
Anticardiolipin IgM: <9 MPL U/mL (ref 0–12)

## 2020-12-28 LAB — PROTHROMBIN GENE MUTATION

## 2020-12-29 LAB — FACTOR 5 LEIDEN

## 2020-12-30 ENCOUNTER — Telehealth: Payer: Self-pay | Admitting: Family

## 2020-12-30 DIAGNOSIS — D6859 Other primary thrombophilia: Secondary | ICD-10-CM

## 2020-12-30 DIAGNOSIS — I825Y1 Chronic embolism and thrombosis of unspecified deep veins of right proximal lower extremity: Secondary | ICD-10-CM

## 2020-12-30 NOTE — Telephone Encounter (Signed)
I was able to speak with Ms. Wilma Flavin fiance Costella Hatcher and go over her recent lab work. She had slightly low protein S total and activity which can cause increased risk of thrombus. She has an appointment with a gynecologist to get her off her current estrogen based birth control and onto progesterone based. She has follow-up with repeat lab and [place] on July. No questions at this time. He was appreciative if call.

## 2021-03-24 ENCOUNTER — Other Ambulatory Visit: Payer: Self-pay | Admitting: *Deleted

## 2021-03-24 ENCOUNTER — Inpatient Hospital Stay: Payer: 59 | Attending: Family

## 2021-03-24 ENCOUNTER — Encounter: Payer: Self-pay | Admitting: Family

## 2021-03-24 ENCOUNTER — Inpatient Hospital Stay (HOSPITAL_BASED_OUTPATIENT_CLINIC_OR_DEPARTMENT_OTHER): Payer: 59 | Admitting: Family

## 2021-03-24 ENCOUNTER — Other Ambulatory Visit: Payer: Self-pay

## 2021-03-24 VITALS — BP 107/68 | HR 63 | Temp 98.0°F | Resp 15 | Wt 213.0 lb

## 2021-03-24 DIAGNOSIS — I825Y1 Chronic embolism and thrombosis of unspecified deep veins of right proximal lower extremity: Secondary | ICD-10-CM

## 2021-03-24 DIAGNOSIS — D6859 Other primary thrombophilia: Secondary | ICD-10-CM | POA: Diagnosis not present

## 2021-03-24 DIAGNOSIS — Z7901 Long term (current) use of anticoagulants: Secondary | ICD-10-CM | POA: Insufficient documentation

## 2021-03-24 DIAGNOSIS — I824Z9 Acute embolism and thrombosis of unspecified deep veins of unspecified distal lower extremity: Secondary | ICD-10-CM

## 2021-03-24 DIAGNOSIS — I82531 Chronic embolism and thrombosis of right popliteal vein: Secondary | ICD-10-CM | POA: Insufficient documentation

## 2021-03-24 LAB — CMP (CANCER CENTER ONLY)
ALT: 35 U/L (ref 0–44)
AST: 24 U/L (ref 15–41)
Albumin: 4 g/dL (ref 3.5–5.0)
Alkaline Phosphatase: 70 U/L (ref 38–126)
Anion gap: 3 — ABNORMAL LOW (ref 5–15)
BUN: 14 mg/dL (ref 6–20)
CO2: 25 mmol/L (ref 22–32)
Calcium: 9 mg/dL (ref 8.9–10.3)
Chloride: 108 mmol/L (ref 98–111)
Creatinine: 0.77 mg/dL (ref 0.44–1.00)
GFR, Estimated: >60 mL/min (ref >60)
Glucose, Bld: 104 mg/dL — ABNORMAL HIGH (ref 70–99)
Potassium: 4 mmol/L (ref 3.5–5.1)
Sodium: 136 mmol/L (ref 135–145)
Total Bilirubin: 0.5 mg/dL (ref 0.3–1.2)
Total Protein: 7.3 g/dL (ref 6.5–8.1)

## 2021-03-24 LAB — CBC WITH DIFFERENTIAL (CANCER CENTER ONLY)
Abs Immature Granulocytes: 0.02 10*3/uL (ref 0.00–0.07)
Basophils Absolute: 0.1 10*3/uL (ref 0.0–0.1)
Basophils Relative: 1 %
Eosinophils Absolute: 0.2 10*3/uL (ref 0.0–0.5)
Eosinophils Relative: 2 %
HCT: 41.5 % (ref 36.0–46.0)
Hemoglobin: 13.8 g/dL (ref 12.0–15.0)
Immature Granulocytes: 0 %
Lymphocytes Relative: 35 %
Lymphs Abs: 2.7 10*3/uL (ref 0.7–4.0)
MCH: 29.9 pg (ref 26.0–34.0)
MCHC: 33.3 g/dL (ref 30.0–36.0)
MCV: 89.8 fL (ref 80.0–100.0)
Monocytes Absolute: 0.5 10*3/uL (ref 0.1–1.0)
Monocytes Relative: 7 %
Neutro Abs: 4.3 10*3/uL (ref 1.7–7.7)
Neutrophils Relative %: 55 %
Platelet Count: 262 10*3/uL (ref 150–400)
RBC: 4.62 MIL/uL (ref 3.87–5.11)
RDW: 13.8 % (ref 11.5–15.5)
WBC Count: 7.7 10*3/uL (ref 4.0–10.5)
nRBC: 0 % (ref 0.0–0.2)

## 2021-03-24 NOTE — Progress Notes (Signed)
Hematology and Oncology Follow Up Visit  Latoya Kennedy [id number] [birth year] 35 y.o. 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 She is doing quite well and has no complaints at this time.  She completely stopped her birthcontrol. Her cycle has been a little heavier on anticoagulation. No other blood loss noted. No bruising or petechiae.  No fever, chills, n/v, cough, rash, dizziness, SOB, chest pain, palpitations, abdominal pain or changes in bowel or bladder habits.  No swelling, tenderness, numbness or tingling in her extremities. She is wearing a compression stocking on the right lower extremity for added support.  No falls or syncope to report.  She has a good appetite and is staying well hydrated. Her weight is stable at 213 lbs.   ECOG Performance Status: 1 - Symptomatic but completely ambulatory  Medications:  Allergies as of 03/24/2021   No Known Allergies      Medication List        Accurate as of March 24, 2021 10:28 AM. If you have any questions, ask your nurse or doctor.          Apixaban Starter Pack (10mg  and 5mg ) Commonly known as: ELIQUIS STARTER PACK Take as directed on package: start with two-5mg  tablets twice daily for 7 days. On day 8, switch to one-5mg  tablet twice daily.        Allergies: No Known Allergies  Past Medical History, Surgical history, Social history, and Family History were reviewed and updated.  Review of Systems: All other 10 point review of systems is negative.   Physical Exam:  weight is 213 lb (96.6 kg). Her oral temperature is 98 F (36.7 C). Her blood pressure is 107/68 and her pulse is 63. Her respiration is 15 and oxygen  saturation is 100%.   Wt Readings from Last 3 Encounters:  03/24/21 213 lb (96.6 kg)  12/23/20 213 lb (96.6 kg)  08/01/20 200 lb (90.7 kg)    Ocular: Sclerae unicteric, pupils equal, round and reactive to light Ear-nose-throat: Oropharynx clear, dentition fair Lymphatic: No cervical or supraclavicular adenopathy Lungs no rales or rhonchi, good excursion bilaterally Heart regular rate and rhythm, no murmur appreciated Abd soft, nontender, positive bowel sounds MSK no focal spinal tenderness, no joint edema Neuro: non-focal, well-oriented, appropriate affect Breasts: Deferred   Lab Results  Component Value Date   WBC 7.7 03/24/2021   HGB 13.8 03/24/2021   HCT 41.5 03/24/2021   MCV 89.8 03/24/2021   PLT 262 03/24/2021   No results found for: FERRITIN, IRON, TIBC, UIBC, IRONPCTSAT Lab Results  Component Value Date   RBC 4.62 03/24/2021   No results found for: KPAFRELGTCHN, LAMBDASER, KAPLAMBRATIO No results found for: IGGSERUM, IGA, IGMSERUM No results found for: Ronnald Ramp, A1GS, A2GS, Tillman Sers, SPEI   Chemistry      Component Value Date/Time   NA 136 03/24/2021 0934   K 4.0 03/24/2021 0934   CL 108 03/24/2021 0934   CO2 25 03/24/2021 0934   BUN 14 03/24/2021 0934   CREATININE 0.77 03/24/2021 0934      Component Value Date/Time   CALCIUM 9.0 03/24/2021 0934   ALKPHOS 70 03/24/2021 0934   AST 24 03/24/2021 0934  ALT 35 03/24/2021 0934   BILITOT 0.5 03/24/2021 0934       Impression and Plan: Latoya Kennedy is a very pleasant 35 yo Hispanic female with a now 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 This occurred post surgery and while on estrogen based birth control.  Protein S studies are pending.  She is scheduled for repeat US on 04/06/2021.  She will continue her same regimen with Eliquis.  Follow-up in 4 months.   They can contact our office with any questions or concerns.   Laverna Peace, NP 7/15/202210:28 AM

## 2021-03-27 LAB — PROTEIN S, TOTAL: Protein S Ag, Total: 101 % (ref 60–150)

## 2021-03-27 LAB — PROTEIN S ACTIVITY: Protein S Activity: 88 % (ref 63–140)

## 2021-04-06 ENCOUNTER — Other Ambulatory Visit: Payer: Self-pay

## 2021-04-06 ENCOUNTER — Ambulatory Visit (HOSPITAL_BASED_OUTPATIENT_CLINIC_OR_DEPARTMENT_OTHER)
Admission: RE | Admit: 2021-04-06 | Discharge: 2021-04-06 | Disposition: A | Discharge status: Home / Self Care | Payer: 59 | Source: Ambulatory Visit | Attending: Family | Admitting: Family

## 2021-04-06 DIAGNOSIS — I825Y1 Chronic embolism and thrombosis of unspecified deep veins of right proximal lower extremity: Secondary | ICD-10-CM | POA: Diagnosis present

## 2021-04-07 ENCOUNTER — Encounter: Payer: Self-pay | Admitting: *Deleted

## 2021-04-17 ENCOUNTER — Telehealth: Payer: Self-pay | Admitting: *Deleted

## 2021-04-17 NOTE — Telephone Encounter (Signed)
Message received from patient's fiance wanting information regarding pt.'s Eliquis and [place].  Call placed back to [facility] and [place] notified per order of S. McLendon-Chisholm NP that "your Ultrasound shows improvement in DVT!!! You have some residual clot that is possibly chronic but the veins have blood flow again! This is great news. Continue taking the anticoagulation as prescribed and we will see you again at follow-up."  Latoya Kennedy is appreciative of information and has no further questions at this time.

## 2021-04-20 ENCOUNTER — Encounter: Payer: Self-pay | Admitting: *Deleted

## 2021-04-20 ENCOUNTER — Telehealth: Payer: Self-pay

## 2021-04-20 NOTE — Telephone Encounter (Signed)
R/c to [facility] regarding clearance for Eliquis prior to surgery.  Pt has not been scheduled yet but plans to schedule her knee sx after the office gets clearance and instructions on how to take the Eliquis. Pt will be seeing Dr Sharman Crate and orthopedic surgeon with [facility] in [place], [place]. Their fax # is [phone]. Please send clearance letter to fax number, thank you!

## 2021-04-24 ENCOUNTER — Other Ambulatory Visit: Payer: Self-pay | Admitting: *Deleted

## 2021-04-24 MED ORDER — APIXABAN 5 MG PO TABS: 5.0000 mg | ORAL_TABLET | Freq: Two times a day (BID) | ORAL | 4 refills | Status: DC

## 2021-07-28 ENCOUNTER — Inpatient Hospital Stay: Payer: 59 | Attending: Hematology & Oncology

## 2021-07-28 ENCOUNTER — Other Ambulatory Visit: Payer: Self-pay

## 2021-07-28 ENCOUNTER — Encounter: Payer: Self-pay | Admitting: Family

## 2021-07-28 ENCOUNTER — Inpatient Hospital Stay (HOSPITAL_BASED_OUTPATIENT_CLINIC_OR_DEPARTMENT_OTHER): Payer: 59 | Admitting: Family

## 2021-07-28 VITALS — BP 112/67 | HR 64 | Temp 98.3°F | Resp 18 | Ht 71.0 in | Wt 220.0 lb

## 2021-07-28 DIAGNOSIS — I825Y1 Chronic embolism and thrombosis of unspecified deep veins of right proximal lower extremity: Secondary | ICD-10-CM | POA: Diagnosis not present

## 2021-07-28 DIAGNOSIS — I824Z9 Acute embolism and thrombosis of unspecified deep veins of unspecified distal lower extremity: Secondary | ICD-10-CM

## 2021-07-28 DIAGNOSIS — Z7901 Long term (current) use of anticoagulants: Secondary | ICD-10-CM | POA: Diagnosis not present

## 2021-07-28 DIAGNOSIS — I82531 Chronic embolism and thrombosis of right popliteal vein: Secondary | ICD-10-CM | POA: Insufficient documentation

## 2021-07-28 DIAGNOSIS — D6859 Other primary thrombophilia: Secondary | ICD-10-CM

## 2021-07-28 LAB — CMP (CANCER CENTER ONLY)
ALT: 29 U/L (ref 0–44)
AST: 20 U/L (ref 15–41)
Albumin: 4.2 g/dL (ref 3.5–5.0)
Alkaline Phosphatase: 72 U/L (ref 38–126)
Anion gap: 7 (ref 5–15)
BUN: 19 mg/dL (ref 6–20)
CO2: 27 mmol/L (ref 22–32)
Calcium: 9.7 mg/dL (ref 8.9–10.3)
Chloride: 106 mmol/L (ref 98–111)
Creatinine: 0.93 mg/dL (ref 0.44–1.00)
GFR, Estimated: >60 mL/min (ref >60)
Glucose, Bld: 87 mg/dL (ref 70–99)
Potassium: 4.2 mmol/L (ref 3.5–5.1)
Sodium: 140 mmol/L (ref 135–145)
Total Bilirubin: 0.4 mg/dL (ref 0.3–1.2)
Total Protein: 7.2 g/dL (ref 6.5–8.1)

## 2021-07-28 LAB — CBC WITH DIFFERENTIAL (CANCER CENTER ONLY)
Abs Immature Granulocytes: 0.03 10*3/uL (ref 0.00–0.07)
Basophils Absolute: 0.1 10*3/uL (ref 0.0–0.1)
Basophils Relative: 1 %
Eosinophils Absolute: 0.2 10*3/uL (ref 0.0–0.5)
Eosinophils Relative: 2 %
HCT: 40 % (ref 36.0–46.0)
Hemoglobin: 13.3 g/dL (ref 12.0–15.0)
Immature Granulocytes: 0 %
Lymphocytes Relative: 37 %
Lymphs Abs: 3.5 10*3/uL (ref 0.7–4.0)
MCH: 30.2 pg (ref 26.0–34.0)
MCHC: 33.3 g/dL (ref 30.0–36.0)
MCV: 90.7 fL (ref 80.0–100.0)
Monocytes Absolute: 0.8 10*3/uL (ref 0.1–1.0)
Monocytes Relative: 9 %
Neutro Abs: 4.9 10*3/uL (ref 1.7–7.7)
Neutrophils Relative %: 51 %
Platelet Count: 275 10*3/uL (ref 150–400)
RBC: 4.41 MIL/uL (ref 3.87–5.11)
RDW: 14.3 % (ref 11.5–15.5)
WBC Count: 9.5 10*3/uL (ref 4.0–10.5)
nRBC: 0 % (ref 0.0–0.2)

## 2021-07-28 NOTE — Progress Notes (Signed)
Hematology and Oncology Follow Up Visit  Latoya Kennedy [id number] February 21, 1986 35 y.o. 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 She is doing well and has no complaints at this time.  She continues to tolerate Eliquis nicely.  No issues with blood loss. Her cycle is regular and flow is normal.  She did well with her right arthroscopic knee surgery.  No bruising or petechiae.  No swelling, tenderness, numbness or tingling in her extremities.  No falls or syncope reported.  No fever, chills, n/v, cough, rash, dizziness, SOB, chest pain, palpitations, abdominal pain or changes in bowel or bladder habits.   ECOG Performance Status: 1 - Symptomatic but completely ambulatory  Medications:  Allergies as of 07/28/2021   No Known Allergies      Medication List        Accurate as of July 28, 2021 11:07 AM. If you have any questions, ask your nurse or doctor.          apixaban 5 MG Tabs tablet Commonly known as: ELIQUIS Take 1 tablet (5 mg total) by mouth 2 (two) times daily. What changed: Another medication with the same name was removed. Continue taking this medication, and follow the directions you see here. Changed by: Lottie Dawson, NP        Allergies: No Known Allergies  Past Medical History, Surgical history, Social history, and Family History were reviewed and updated.  Review of Systems: All other 10 point review of systems is negative.   Physical Exam:  height is 5\' 11"  (1.803 m) and weight is 220 lb (99.8 kg). Her oral temperature is 98.3 F (36.8 C). Her blood pressure is 112/67 and her pulse is 64. Her respiration is 18 and oxygen saturation is 99%.   Wt Readings  from Last 3 Encounters:  07/28/21 220 lb (99.8 kg)  03/24/21 213 lb (96.6 kg)  12/23/20 213 lb (96.6 kg)    Ocular: Sclerae unicteric, pupils equal, round and reactive to light Ear-nose-throat: Oropharynx clear, dentition fair Lymphatic: No cervical or supraclavicular adenopathy Lungs no rales or rhonchi, good excursion bilaterally Heart regular rate and rhythm, no murmur appreciated Abd soft, nontender, positive bowel sounds MSK no focal spinal tenderness, no joint edema Neuro: non-focal, well-oriented, appropriate affect Breasts: Deferred   Lab Results  Component Value Date   WBC 9.5 07/28/2021   HGB 13.3 07/28/2021   HCT 40.0 07/28/2021   MCV 90.7 07/28/2021   PLT 275 07/28/2021   No results found for: FERRITIN, IRON, TIBC, UIBC, IRONPCTSAT Lab Results  Component Value Date   RBC 4.41 07/28/2021   No results found for: KPAFRELGTCHN, LAMBDASER, KAPLAMBRATIO No results found for: IGGSERUM, IGA, IGMSERUM No results found for: Ronnald Ramp, A1GS, A2GS, Violet Baldy, MSPIKE, SPEI   Chemistry      Component Value Date/Time   NA 140 07/28/2021 1027   K 4.2 07/28/2021 1027   CL 106 07/28/2021 1027   CO2 27 07/28/2021 1027   BUN 19 07/28/2021 1027   CREATININE 0.93 07/28/2021 1027      Component Value Date/Time   CALCIUM 9.7 07/28/2021 1027   ALKPHOS 72 07/28/2021 1027   AST 20 07/28/2021 1027  ALT 29 07/28/2021 1027   BILITOT 0.4 07/28/2021 1027       Impression and Plan: Latoya Kennedy is a very pleasant 35 yo Hispanic female with a now 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 This occurred post surgery and while on estrogen based birth control.  Protein S studies are pending. Were slightly low at diagnosis.  She will continue her same regimen with Eliquis.  We will see her again in 6 months and repeat her [place] at that time. If  everything is stable/improved we will transition her to maintenance dose Eliquis 2.5 mg PO BID.  She can contact our office with any questions or concerns.   Lottie Dawson, NP 11/18/202211:07 AM

## 2021-07-29 LAB — PROTEIN S ACTIVITY: Protein S Activity: 96 % (ref 63–140)

## 2021-07-29 LAB — PROTEIN S, TOTAL: Protein S Ag, Total: 94 % (ref 60–150)

## 2021-10-08 ENCOUNTER — Other Ambulatory Visit: Payer: Self-pay | Admitting: Hematology & Oncology

## 2021-11-10 ENCOUNTER — Other Ambulatory Visit: Payer: Self-pay | Admitting: Hematology & Oncology

## 2021-12-14 ENCOUNTER — Other Ambulatory Visit: Payer: Self-pay | Admitting: *Deleted

## 2021-12-14 MED ORDER — APIXABAN 5 MG PO TABS: 5.0000 mg | ORAL_TABLET | Freq: Two times a day (BID) | ORAL | 0 refills | Status: DC

## 2022-01-16 ENCOUNTER — Telehealth (HOSPITAL_BASED_OUTPATIENT_CLINIC_OR_DEPARTMENT_OTHER): Payer: Self-pay

## 2022-01-26 ENCOUNTER — Encounter: Payer: Self-pay | Admitting: Family

## 2022-01-26 ENCOUNTER — Ambulatory Visit (HOSPITAL_BASED_OUTPATIENT_CLINIC_OR_DEPARTMENT_OTHER)
Admission: RE | Admit: 2022-01-26 | Discharge: 2022-01-26 | Disposition: A | Discharge status: Home / Self Care | Payer: 59 | Source: Ambulatory Visit | Attending: Family | Admitting: Family

## 2022-01-26 ENCOUNTER — Inpatient Hospital Stay: Payer: 59 | Attending: Hematology & Oncology

## 2022-01-26 ENCOUNTER — Inpatient Hospital Stay (HOSPITAL_BASED_OUTPATIENT_CLINIC_OR_DEPARTMENT_OTHER): Payer: 59 | Admitting: Family

## 2022-01-26 ENCOUNTER — Other Ambulatory Visit: Payer: Self-pay

## 2022-01-26 VITALS — BP 110/72 | HR 62 | Temp 98.3°F | Resp 18 | Ht 71.0 in | Wt 208.0 lb

## 2022-01-26 DIAGNOSIS — Z7901 Long term (current) use of anticoagulants: Secondary | ICD-10-CM | POA: Diagnosis not present

## 2022-01-26 DIAGNOSIS — I82531 Chronic embolism and thrombosis of right popliteal vein: Secondary | ICD-10-CM | POA: Insufficient documentation

## 2022-01-26 DIAGNOSIS — I824Z9 Acute embolism and thrombosis of unspecified deep veins of unspecified distal lower extremity: Secondary | ICD-10-CM | POA: Diagnosis not present

## 2022-01-26 DIAGNOSIS — I825Y1 Chronic embolism and thrombosis of unspecified deep veins of right proximal lower extremity: Secondary | ICD-10-CM

## 2022-01-26 DIAGNOSIS — D6859 Other primary thrombophilia: Secondary | ICD-10-CM | POA: Diagnosis not present

## 2022-01-26 LAB — CBC WITH DIFFERENTIAL (CANCER CENTER ONLY)
Abs Immature Granulocytes: 0.01 10*3/uL (ref 0.00–0.07)
Basophils Absolute: 0.1 10*3/uL (ref 0.0–0.1)
Basophils Relative: 1 %
Eosinophils Absolute: 0.2 10*3/uL (ref 0.0–0.5)
Eosinophils Relative: 2 %
HCT: 39.8 % (ref 36.0–46.0)
Hemoglobin: 13.2 g/dL (ref 12.0–15.0)
Immature Granulocytes: 0 %
Lymphocytes Relative: 30 %
Lymphs Abs: 2.1 10*3/uL (ref 0.7–4.0)
MCH: 29.8 pg (ref 26.0–34.0)
MCHC: 33.2 g/dL (ref 30.0–36.0)
MCV: 89.8 fL (ref 80.0–100.0)
Monocytes Absolute: 0.5 10*3/uL (ref 0.1–1.0)
Monocytes Relative: 8 %
Neutro Abs: 4.2 10*3/uL (ref 1.7–7.7)
Neutrophils Relative %: 59 %
Platelet Count: 272 10*3/uL (ref 150–400)
RBC: 4.43 MIL/uL (ref 3.87–5.11)
RDW: 14.5 % (ref 11.5–15.5)
WBC Count: 7.1 10*3/uL (ref 4.0–10.5)
nRBC: 0 % (ref 0.0–0.2)

## 2022-01-26 LAB — CMP (CANCER CENTER ONLY)
ALT: 49 U/L — ABNORMAL HIGH (ref 0–44)
AST: 35 U/L (ref 15–41)
Albumin: 4.1 g/dL (ref 3.5–5.0)
Alkaline Phosphatase: 64 U/L (ref 38–126)
Anion gap: 6 (ref 5–15)
BUN: 17 mg/dL (ref 6–20)
CO2: 27 mmol/L (ref 22–32)
Calcium: 9.4 mg/dL (ref 8.9–10.3)
Chloride: 105 mmol/L (ref 98–111)
Creatinine: 0.85 mg/dL (ref 0.44–1.00)
GFR, Estimated: >60 mL/min (ref >60)
Glucose, Bld: 84 mg/dL (ref 70–99)
Potassium: 4 mmol/L (ref 3.5–5.1)
Sodium: 138 mmol/L (ref 135–145)
Total Bilirubin: 0.4 mg/dL (ref 0.3–1.2)
Total Protein: 7.1 g/dL (ref 6.5–8.1)

## 2022-01-26 MED ORDER — APIXABAN 2.5 MG PO TABS: 2.5000 mg | ORAL_TABLET | Freq: Two times a day (BID) | ORAL | 5 refills | Status: DC

## 2022-01-26 NOTE — Progress Notes (Signed)
Hematology and Oncology Follow Up Visit  Latoya Kennedy [id number] [birth year] 36 y.o. 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 She is doing well and has no complaints at this time.  Her cycles is regular and heavy only on the first day. No other blood loss noted.  No bruising or petechiae.  She is doing well on Eliquis. We will now decrease her dose to 2.5 mg PO BID.  She has not noted any swelling, tenderness, numbness or tingling in her extremities.  No falls or syncope.  Appetite and hydration are good. Her weight is stable at 208 lbs.   ECOG Performance Status: 1 - Symptomatic but completely ambulatory  Medications:  Allergies as of 01/26/2022   No Known Allergies      Medication List        Accurate as of Jan 26, 2022 10:32 AM. If you have any questions, ask your nurse or doctor.          apixaban 5 MG Tabs tablet Commonly known as: Eliquis Take 1 tablet (5 mg total) by mouth 2 (two) times daily.        Allergies: No Known Allergies  Past Medical History, Surgical history, Social history, and Family History were reviewed and updated.  Review of Systems: All other 10 point review of systems is negative.   Physical Exam:  vitals were not taken for this visit.   Wt Readings from Last 3 Encounters:  07/28/21 220 lb (99.8 kg)  03/24/21 213 lb (96.6 kg)  12/23/20 213 lb (96.6 kg)    Ocular: Sclerae unicteric, pupils equal, round and reactive to light Ear-nose-throat: Oropharynx clear, dentition fair Lymphatic: No cervical or supraclavicular adenopathy Lungs no rales or rhonchi, good excursion bilaterally Heart regular rate and rhythm, no murmur appreciated Abd  soft, nontender, positive bowel sounds MSK no focal spinal tenderness, no joint edema Neuro: non-focal, well-oriented, appropriate affect Breasts: Deferred   Lab Results  Component Value Date   WBC 7.1 01/26/2022   HGB 13.2 01/26/2022   HCT 39.8 01/26/2022   MCV 89.8 01/26/2022   PLT 272 01/26/2022   No results found for: FERRITIN, IRON, TIBC, UIBC, IRONPCTSAT Lab Results  Component Value Date   RBC 4.43 01/26/2022   No results found for: KPAFRELGTCHN, LAMBDASER, KAPLAMBRATIO No results found for: IGGSERUM, IGA, IGMSERUM No results found for: Ronnald Ramp, A1GS, Nelida Meuse, SPEI   Chemistry      Component Value Date/Time   NA 140 07/28/2021 1027   K 4.2 07/28/2021 1027   CL 106 07/28/2021 1027   CO2 27 07/28/2021 1027   BUN 19 07/28/2021 1027   CREATININE 0.93 07/28/2021 1027      Component Value Date/Time   CALCIUM 9.7 07/28/2021 1027   ALKPHOS 72 07/28/2021 1027   AST 20 07/28/2021 1027   ALT 29 07/28/2021 1027   BILITOT 0.4 07/28/2021 1027       Impression and Plan: Latoya Kennedy is a very pleasant 36 yo Hispanic female with a now 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 This occurred post surgery and while on estrogen  based birth control.  Protein S studies were slightly low at diagnosis.  We will get an [place] later today to assess her right lower extremity DVT.  We will decrease her Eliquis to 2.5 mg PO BID Follow-up in another 6 months.   Lottie Dawson, NP 5/19/202310:32 AM

## 2022-01-27 LAB — PROTEIN S ACTIVITY: Protein S Activity: 67 % (ref 63–140)

## 2022-01-27 LAB — PROTEIN S, TOTAL: Protein S Ag, Total: 104 % (ref 60–150)

## 2022-07-27 ENCOUNTER — Inpatient Hospital Stay: Payer: 59

## 2022-07-27 ENCOUNTER — Ambulatory Visit: Payer: 59 | Admitting: Family

## 2022-09-21 IMAGING — US US EXTREM LOW VENOUS*R*
1 series · 14 of 24 positions shown · non-contrast
Comparison: None.

CLINICAL DATA: Recent ACL surgery.

EXAM:
RIGHT LOWER EXTREMITY VENOUS DOPPLER ULTRASOUND
TECHNIQUE: Gray-scale sonography with compression, as well as color and duplex
ultrasound, were performed to evaluate the deep venous system(s)
from the level of the common femoral vein through the popliteal and
proximal calf veins.

[Series 1: us extrem low venous*right* · 14 of 32 slices shown]
[im 1/32]
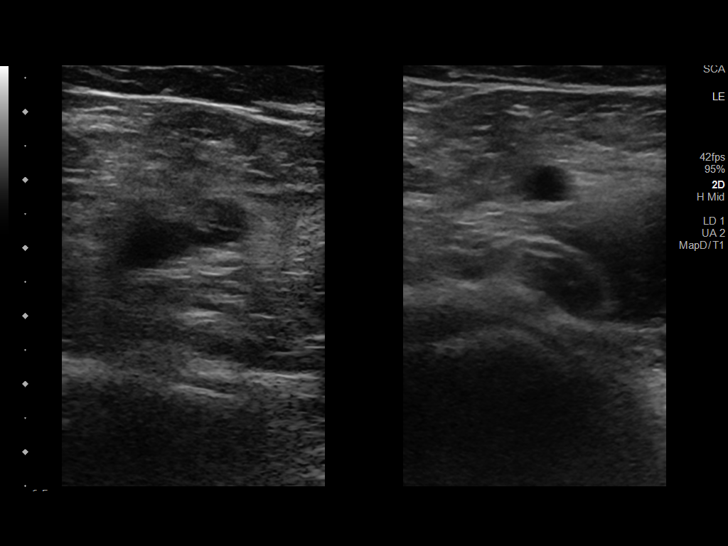
[im 3/32]
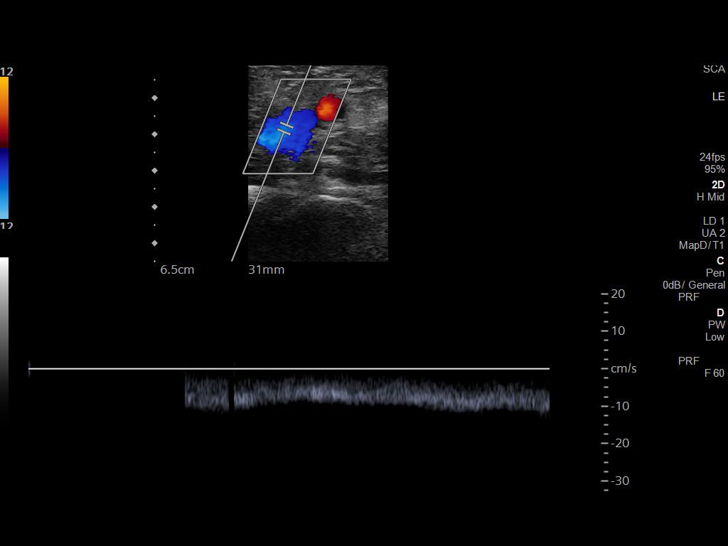
[im 6/32]
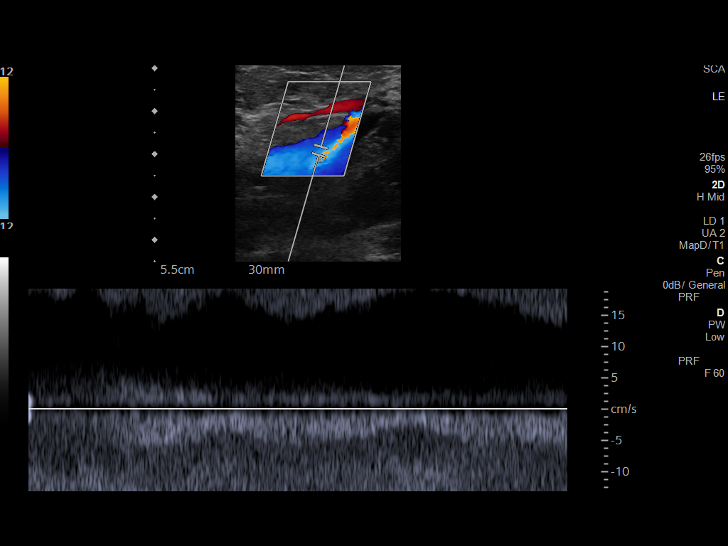
[im 9/32]
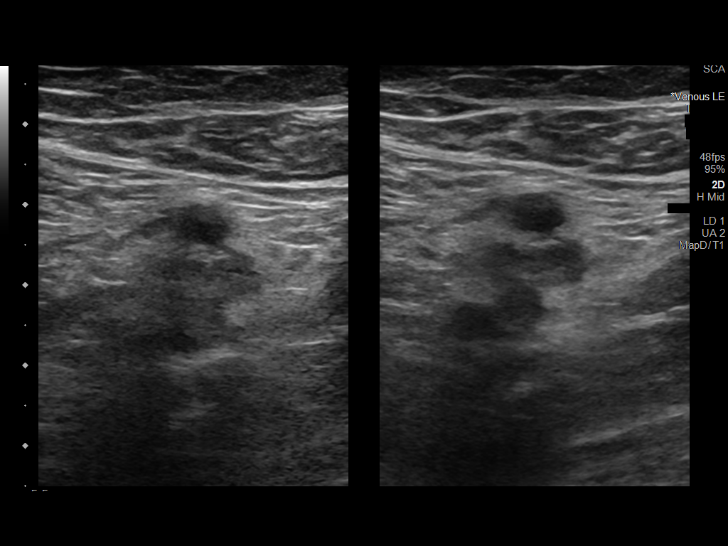
[im 10/32]
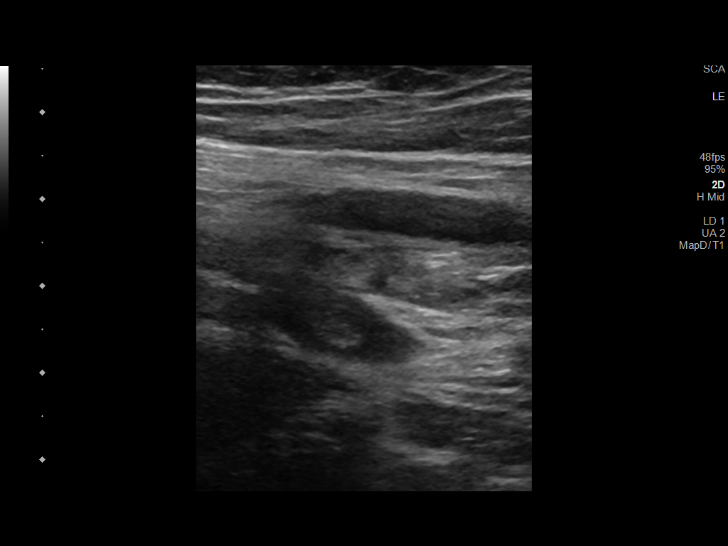
[im 13/32]
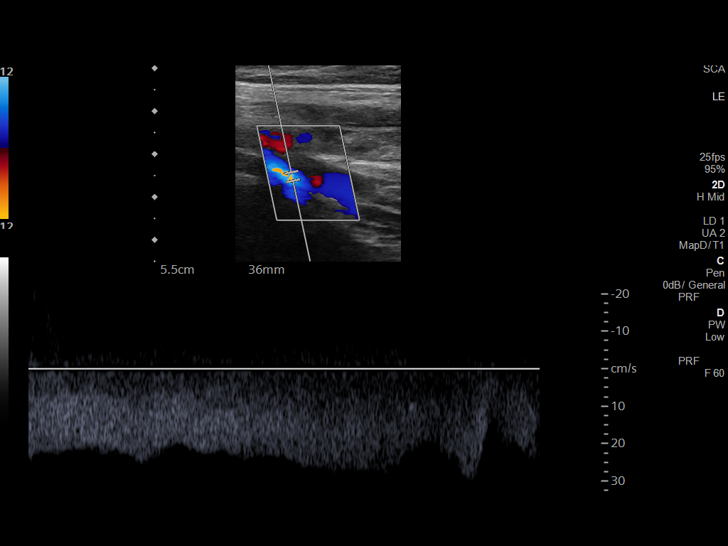
[im 15/32]
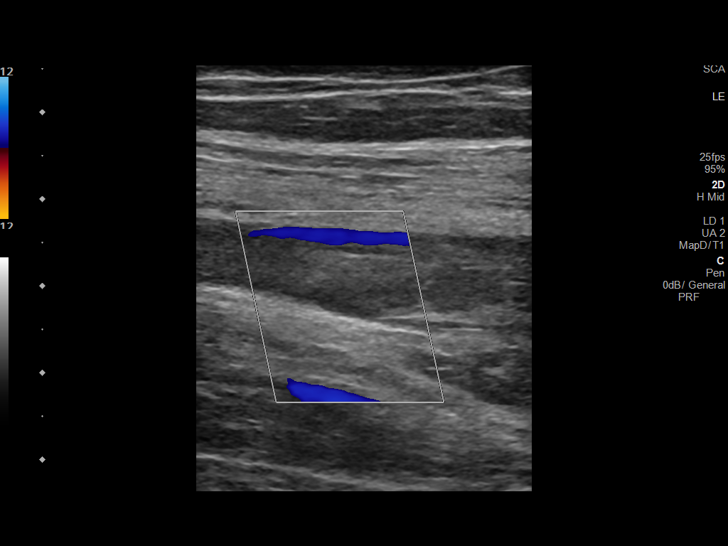
[im 17/32]
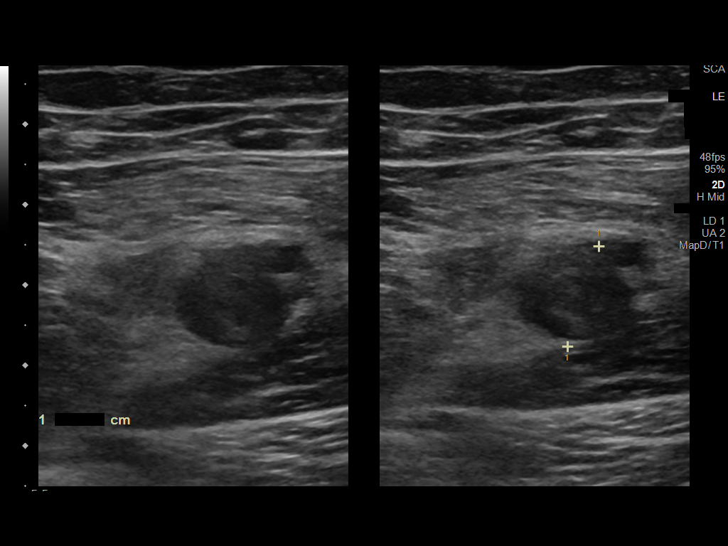
[im 19/32]
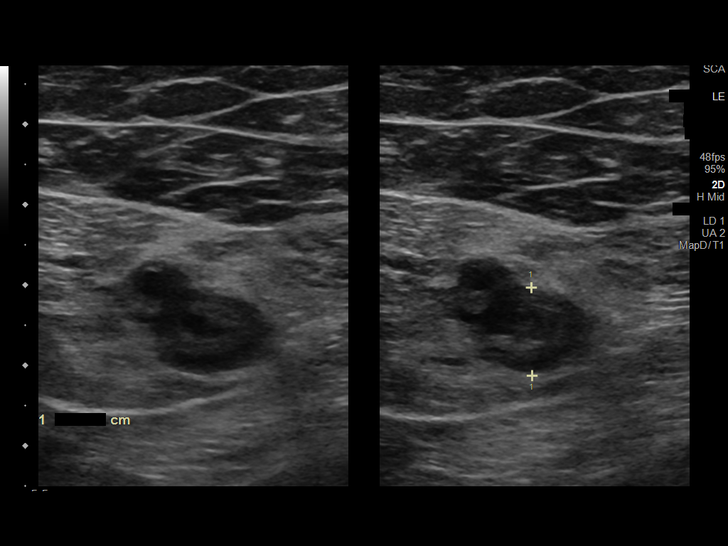
[im 22/32]
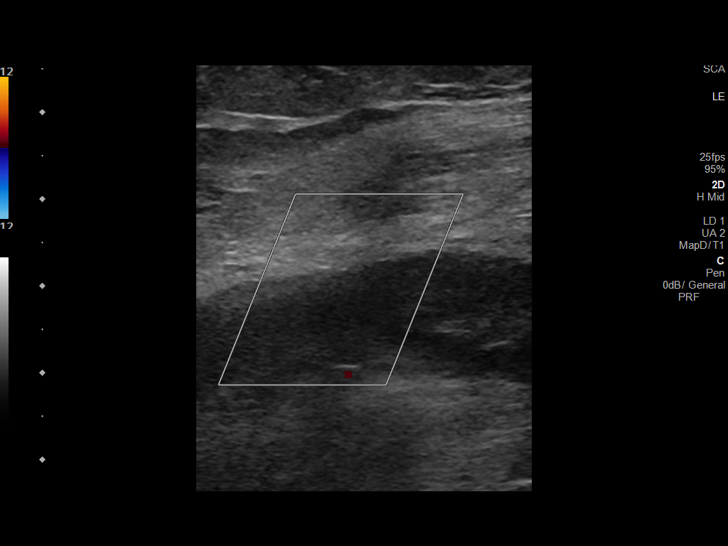
[im 25/32]
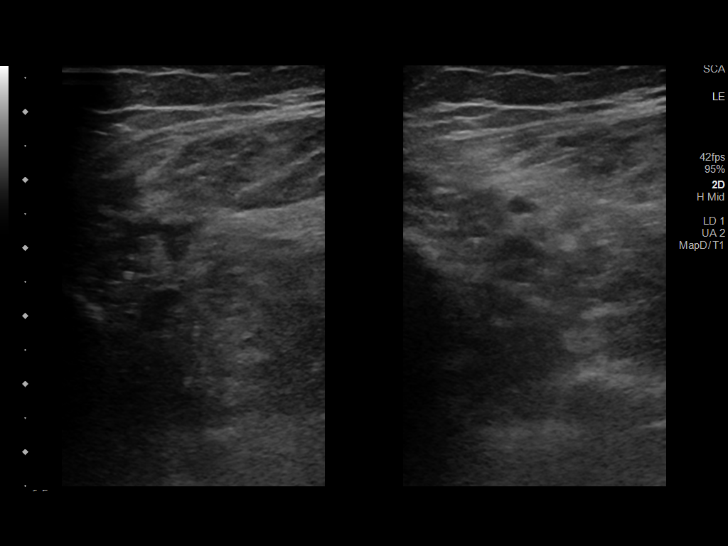
[im 26/32]
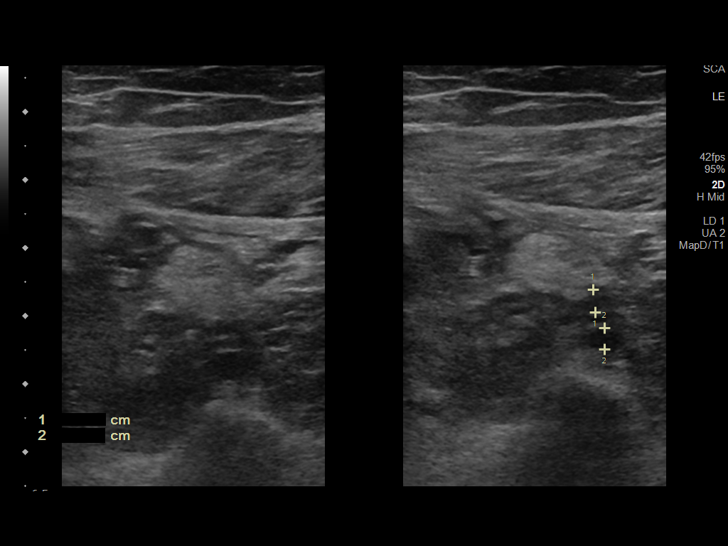
[im 29/32]
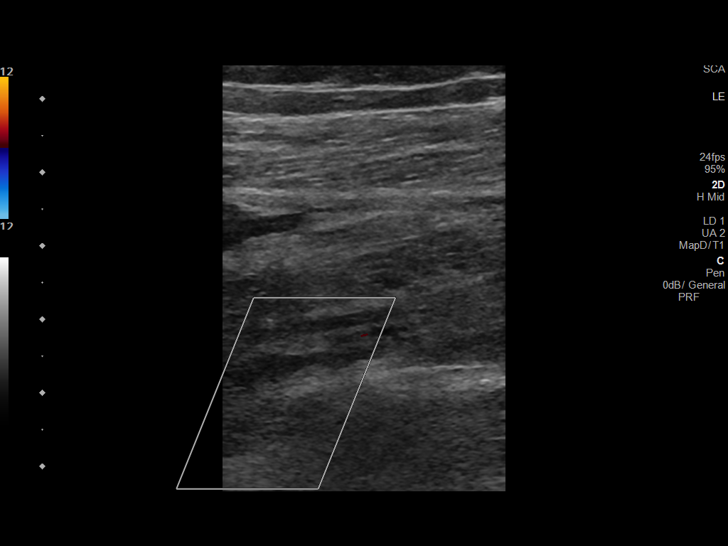
[im 32/32]
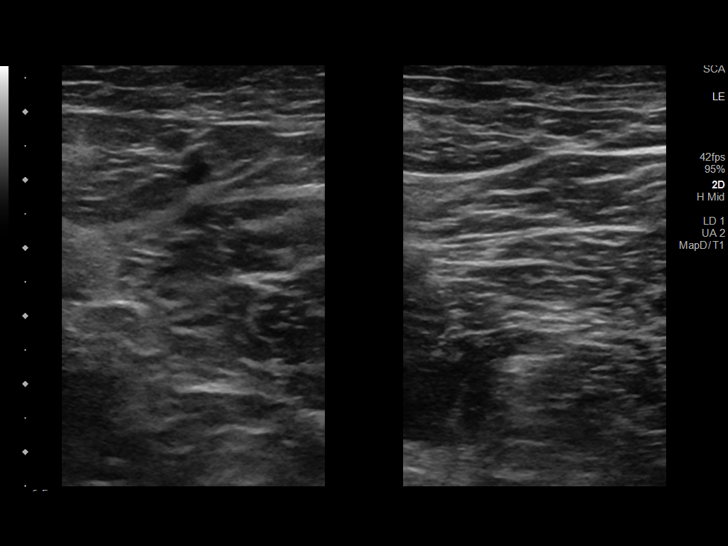

[14 of 24 positions shown; findings below may reference images not displayed]

FINDINGS: VENOUS

There is evidence of thrombus within the right common femoral vein,
profundus femoral vein, femoral vein, popliteal vein, posterior
tibial vein, peroneal vein and gastrocnemius vein.

No evidence of thrombus at the right saphenofemoral junction.

Limited views of the contralateral common femoral vein are
unremarkable.
IMPRESSION: Positive for extensive DVT in the right lower extremity, as detailed
above.

Findings discussed with Dr. Hemal via telephone at [DATE] p.m.

## 2022-10-19 ENCOUNTER — Inpatient Hospital Stay: Payer: BC Managed Care – PPO

## 2022-10-19 ENCOUNTER — Encounter: Payer: Self-pay | Admitting: Family

## 2022-10-19 ENCOUNTER — Inpatient Hospital Stay: Payer: BC Managed Care – PPO | Attending: Family | Admitting: Family

## 2022-10-19 ENCOUNTER — Other Ambulatory Visit: Payer: Self-pay

## 2022-10-19 VITALS — BP 111/62 | HR 65 | Temp 97.7°F | Resp 18 | Ht 71.0 in | Wt 221.0 lb

## 2022-10-19 DIAGNOSIS — I824Z9 Acute embolism and thrombosis of unspecified deep veins of unspecified distal lower extremity: Secondary | ICD-10-CM

## 2022-10-19 DIAGNOSIS — Z7901 Long term (current) use of anticoagulants: Secondary | ICD-10-CM | POA: Diagnosis not present

## 2022-10-19 DIAGNOSIS — Z86718 Personal history of other venous thrombosis and embolism: Secondary | ICD-10-CM | POA: Insufficient documentation

## 2022-10-19 DIAGNOSIS — I825Y1 Chronic embolism and thrombosis of unspecified deep veins of right proximal lower extremity: Secondary | ICD-10-CM

## 2022-10-19 DIAGNOSIS — D6859 Other primary thrombophilia: Secondary | ICD-10-CM

## 2022-10-19 LAB — CMP (CANCER CENTER ONLY)
ALT: 22 U/L (ref 10–47)
AST: 22 U/L (ref 11–38)
Albumin: 4.3 g/dL (ref 3.5–5.0)
Alkaline Phosphatase: 77 U/L (ref 38–126)
Anion gap: 8 (ref 5–15)
BUN: 13 mg/dL (ref 6–20)
CO2: 29 mmol/L (ref 22–32)
Calcium: 9.6 mg/dL (ref 8.9–10.3)
Chloride: 103 mmol/L (ref 98–111)
Creatinine: 0.96 mg/dL (ref 0.60–1.20)
Glucose, Bld: 92 mg/dL (ref 70–99)
Potassium: 4.2 mmol/L (ref 3.5–5.1)
Sodium: 140 mmol/L (ref 135–145)
Total Bilirubin: 0.3 mg/dL (ref 0.2–1.6)
Total Protein: 7.6 g/dL (ref 6.5–8.1)

## 2022-10-19 LAB — CBC WITH DIFFERENTIAL (CANCER CENTER ONLY)
Abs Immature Granulocytes: 0.02 10*3/uL (ref 0.00–0.07)
Basophils Absolute: 0.1 10*3/uL (ref 0.0–0.1)
Basophils Relative: 1 %
Eosinophils Absolute: 0.2 10*3/uL (ref 0.0–0.5)
Eosinophils Relative: 3 %
HCT: 42.5 % (ref 36.0–46.0)
Hemoglobin: 13.8 g/dL (ref 12.0–15.0)
Immature Granulocytes: 0 %
Lymphocytes Relative: 33 %
Lymphs Abs: 2.3 10*3/uL (ref 0.7–4.0)
MCH: 29.7 pg (ref 26.0–34.0)
MCHC: 32.5 g/dL (ref 30.0–36.0)
MCV: 91.4 fL (ref 80.0–100.0)
Monocytes Absolute: 0.6 10*3/uL (ref 0.1–1.0)
Monocytes Relative: 8 %
Neutro Abs: 4 10*3/uL (ref 1.7–7.7)
Neutrophils Relative %: 55 %
Platelet Count: 295 10*3/uL (ref 150–400)
RBC: 4.65 MIL/uL (ref 3.87–5.11)
RDW: 14.2 % (ref 11.5–15.5)
WBC Count: 7.1 10*3/uL (ref 4.0–10.5)
nRBC: 0 % (ref 0.0–0.2)

## 2022-10-19 NOTE — Progress Notes (Signed)
Hematology and Oncology Follow Up Visit  Latoya Kennedy [id number] [birth year] 37 y.o. 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 She is doing quite well and has no complaints at this time.  No abnormal blood loss noted. No bruising or petechiae.  No fever, chills, n/v, cough, rash, dizziness, SOB, chest pain, palpitations, abdominal pain or changes in bowel or bladder habits.  No numbness or tingling in her extremities.  She will have some mild swelling in the right leg when on her feet for an extended period of time or traveling. She wears a compression stocking for added support.  No falls or syncope reported.  Appetite and hydration are good. Weight is stable at 221 lbs.    ECOG Performance Status: 1 - Symptomatic but completely ambulatory  Medications:  Allergies as of 10/19/2022   No Known Allergies      Medication List        Accurate as of October 19, 2022  2:24 PM. If you have any questions, ask your nurse or doctor.          apixaban 2.5 MG Tabs tablet Commonly known as: Eliquis Take 1 tablet (2.5 mg total) by mouth 2 (two) times daily.        Allergies: No Known Allergies  Past Medical History, Surgical history, Social history, and Family History were reviewed and updated.  Review of Systems: All other 10 point review of systems is negative.   Physical Exam:  vitals were not taken for this visit.   Wt Readings from Last 3 Encounters:  01/26/22 208 lb (94.3 kg)  07/28/21 220 lb (99.8 kg)  03/24/21 213 lb (96.6 kg)    Ocular: Sclerae unicteric, pupils equal, round and reactive to light Ear-nose-throat: Oropharynx clear, dentition  fair Lymphatic: No cervical or supraclavicular adenopathy Lungs no rales or rhonchi, good excursion bilaterally Heart regular rate and rhythm, no murmur appreciated Abd soft, nontender, positive bowel sounds MSK no focal spinal tenderness, no joint edema Neuro: non-focal, well-oriented, appropriate affect Breasts: Deferred   Lab Results  Component Value Date   WBC 7.1 01/26/2022   HGB 13.2 01/26/2022   HCT 39.8 01/26/2022   MCV 89.8 01/26/2022   PLT 272 01/26/2022   No results found for: "FERRITIN", "IRON", "TIBC", "UIBC", "IRONPCTSAT" Lab Results  Component Value Date   RBC 4.43 01/26/2022   No results found for: "KPAFRELGTCHN", "LAMBDASER", "KAPLAMBRATIO" No results found for: "IGGSERUM", "IGA", "IGMSERUM" No results found for: "TOTALPROTELP", "ALBUMINELP", "A1GS", "A2GS", "BETS", "BETA2SER", "GAMS", "MSPIKE", "SPEI"   Chemistry      Component Value Date/Time   NA 138 01/26/2022 1004   K 4.0 01/26/2022 1004   CL 105 01/26/2022 1004   CO2 27 01/26/2022 1004   BUN 17 01/26/2022 1004   CREATININE 0.85 01/26/2022 1004      Component Value Date/Time   CALCIUM 9.4 01/26/2022 1004   ALKPHOS 64 01/26/2022 1004   AST 35 01/26/2022 1004   ALT 49 (H) 01/26/2022 1004   BILITOT 0.4 01/26/2022 1004       Impression and Plan: Latoya Kennedy is a very pleasant 37 yo Hispanic female with a now chronic DVT focally  occlusive in proximal thigh, partially occlusive mid/distal thigh, right popliteal vein with chronic wall changes, partially occlusive. This occurred post surgery and while on estrogen based birth control.  Protein S studies were slightly low at diagnosis.  She will continue her same regimen with maintenance Eliquis to 2.5 mg PO BID  She and her husband are trying to expand their family. She will let us know as soon as she becomes pregnant.  Follow-up in 6 months.   Lottie Dawson, NP 2/9/20242:24 PM

## 2022-10-20 LAB — PROTEIN S ACTIVITY: Protein S Activity: 94 % (ref 63–140)

## 2022-10-20 LAB — PROTEIN S, TOTAL: Protein S Ag, Total: 92 % (ref 60–150)

## 2023-03-11 ENCOUNTER — Other Ambulatory Visit: Payer: Self-pay | Admitting: *Deleted

## 2023-03-11 DIAGNOSIS — I824Z9 Acute embolism and thrombosis of unspecified deep veins of unspecified distal lower extremity: Secondary | ICD-10-CM

## 2023-03-11 DIAGNOSIS — I825Y1 Chronic embolism and thrombosis of unspecified deep veins of right proximal lower extremity: Secondary | ICD-10-CM

## 2023-03-11 DIAGNOSIS — D6859 Other primary thrombophilia: Secondary | ICD-10-CM

## 2023-03-11 MED ORDER — APIXABAN 2.5 MG PO TABS: 2.5000 mg | ORAL_TABLET | Freq: Two times a day (BID) | ORAL | 5 refills | Status: AC

## 2023-04-19 ENCOUNTER — Inpatient Hospital Stay: Payer: BC Managed Care – PPO | Attending: Hematology & Oncology

## 2023-04-19 ENCOUNTER — Inpatient Hospital Stay: Payer: BC Managed Care – PPO | Admitting: Family

## 2023-05-27 IMAGING — US US EXTREM LOW VENOUS*R*
1 series · 14 of 24 positions shown · non-contrast
Comparison: 08/01/2020.

CLINICAL DATA: Reassess DVT.  Anticoagulation.

EXAM:
RIGHT LOWER EXTREMITY VENOUS DOPPLER ULTRASOUND
TECHNIQUE: Gray-scale sonography with compression, as well as color and duplex
ultrasound, were performed to evaluate the deep venous system(s)
from the level of the common femoral vein through the popliteal and
proximal calf veins.

[Series 1: us extrem low venous*right* · 14 of 45 slices shown]
[im 1/45]
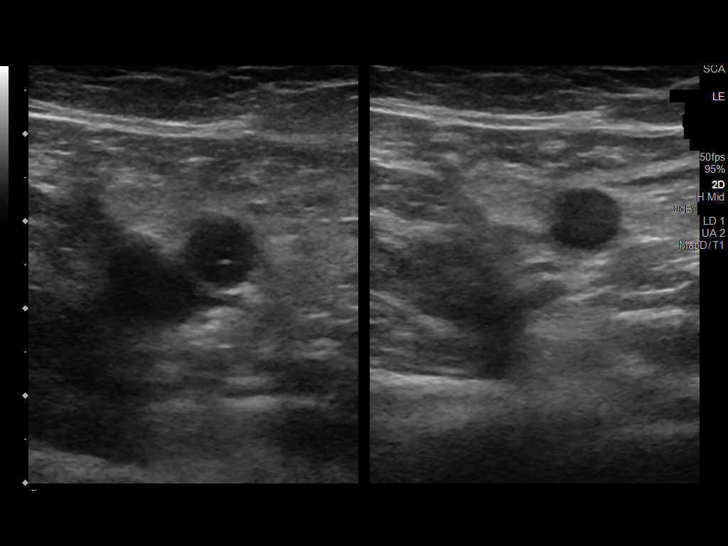
[im 4/45]
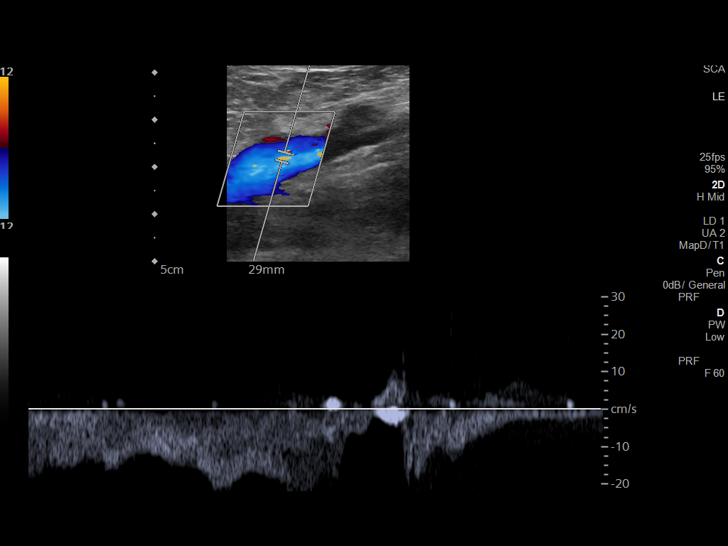
[im 8/45]
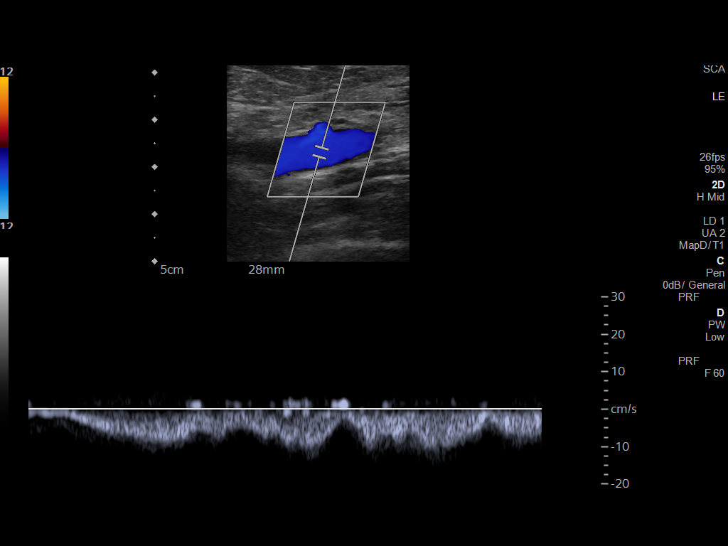
[im 12/45]
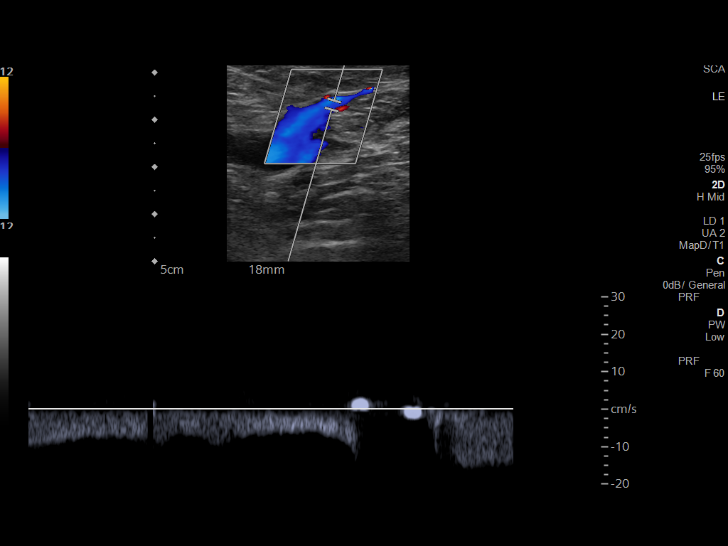
[im 14/45]
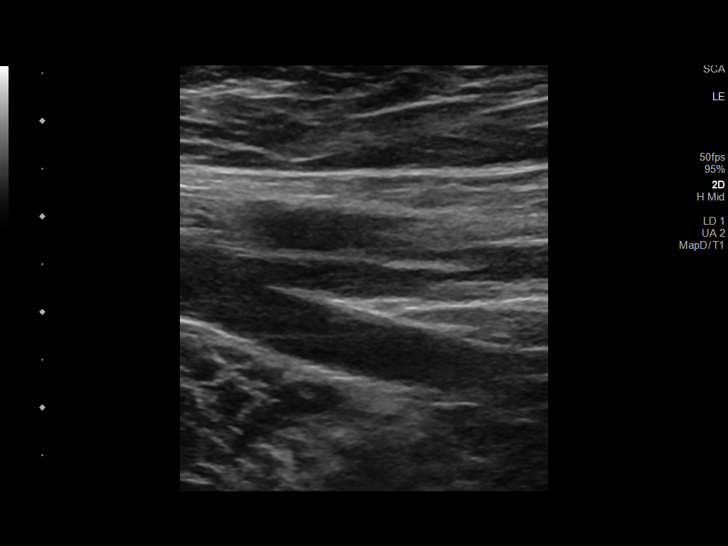
[im 18/45]
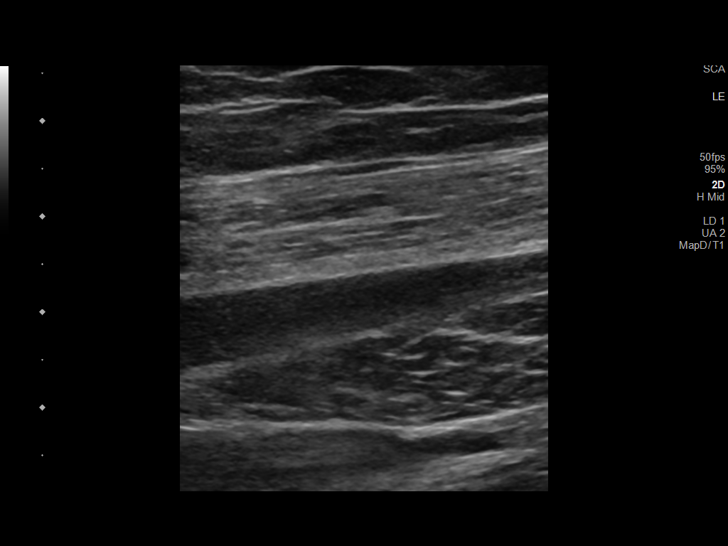
[im 22/45]
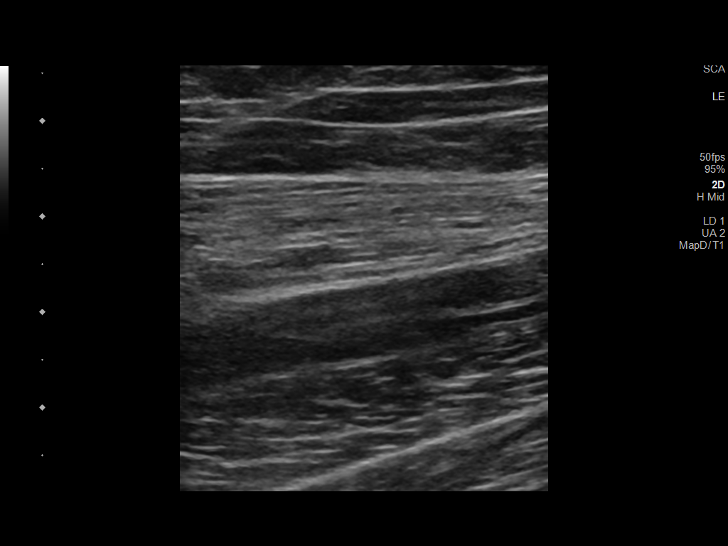
[im 23/45]
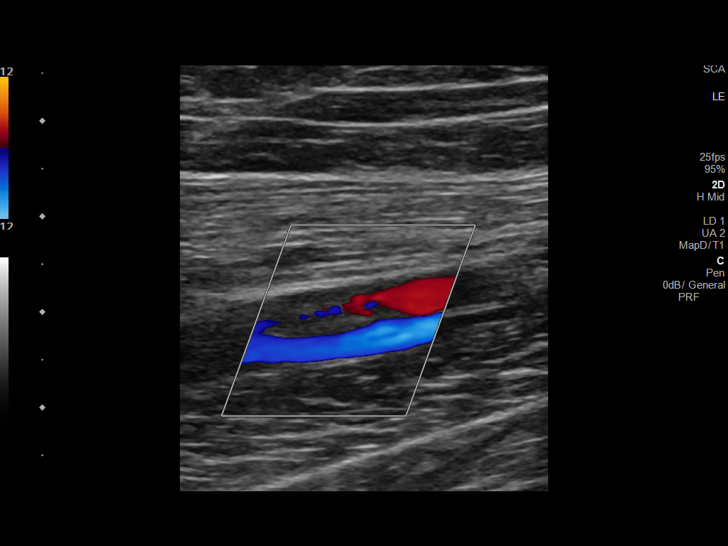
[im 27/45]
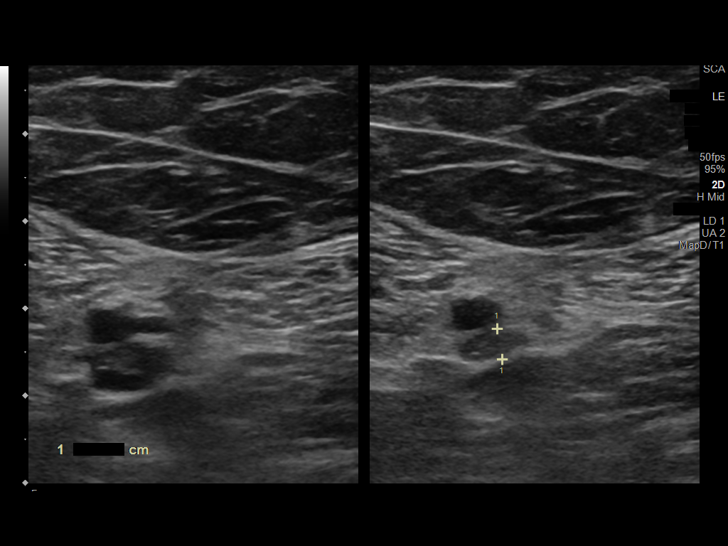
[im 31/45]
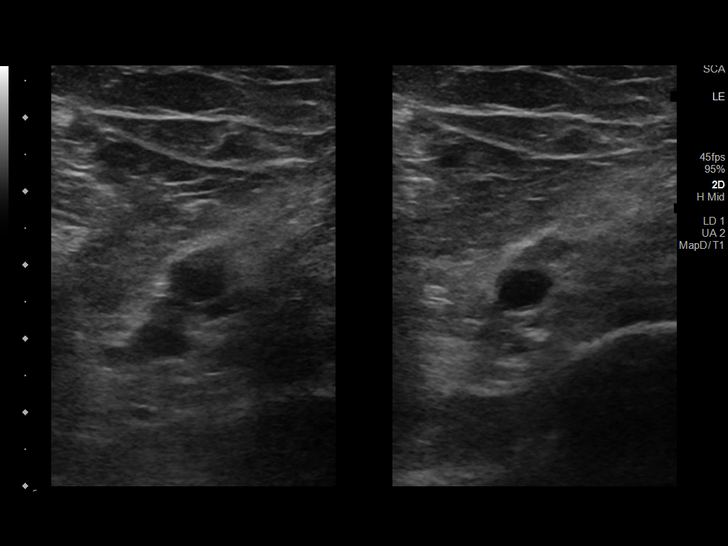
[im 35/45]
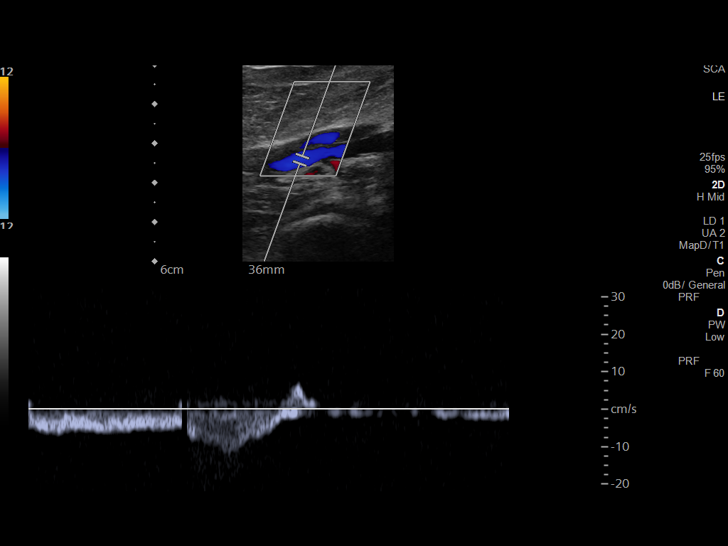
[im 37/45]
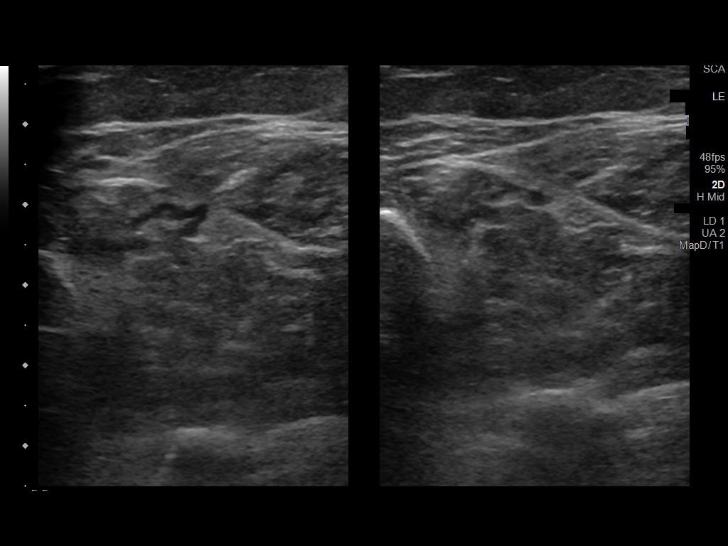
[im 41/45]
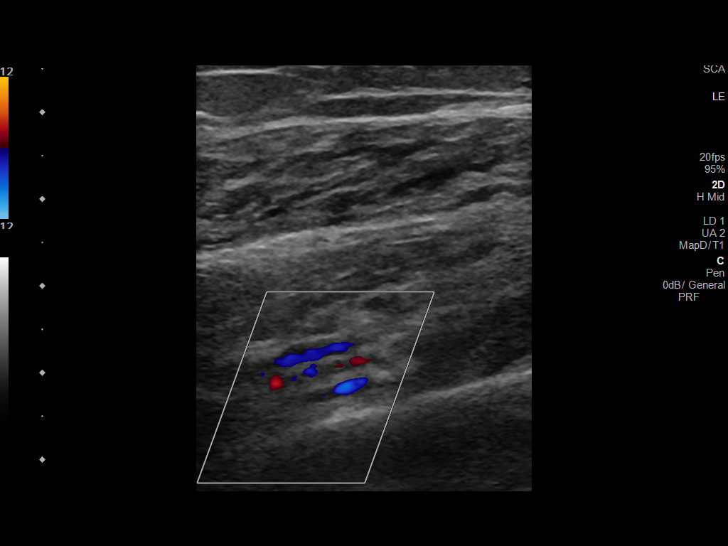
[im 45/45]
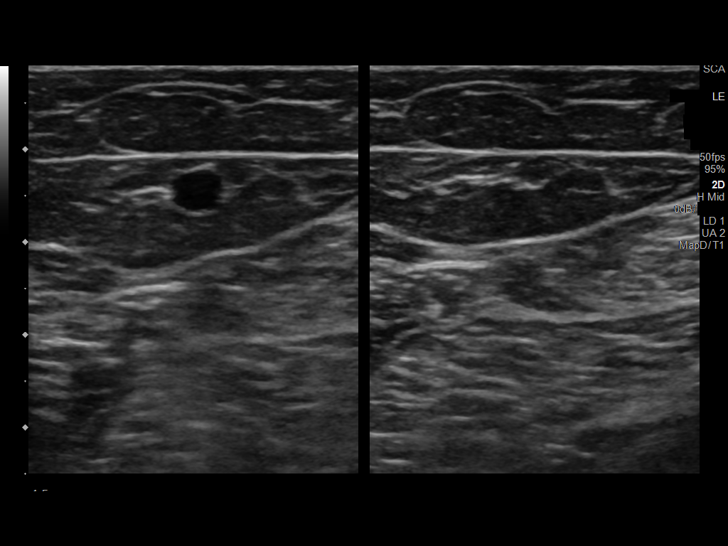

[14 of 24 positions shown; findings below may reference images not displayed]

FINDINGS: VENOUS

Significant clearing of right lower extremity deep venous
thrombosis. Residual nonocclusive thrombus noted in the right
femoral and popliteal veins. This is most likely chronic. No
evidence of acute DVT. Contralateral left common femoral vein widely
patent.

OTHER

None.
IMPRESSION: Significant clearing of right lower extremity deep venous
thrombosis. Residual nonocclusive thrombus noted the right femoral
and popliteal veins. This is most likely chronic. No evidence of
acute DVT.

## 2023-10-11 ENCOUNTER — Other Ambulatory Visit: Payer: Self-pay

## 2023-10-11 ENCOUNTER — Emergency Department (HOSPITAL_BASED_OUTPATIENT_CLINIC_OR_DEPARTMENT_OTHER): Payer: BC Managed Care – PPO

## 2023-10-11 ENCOUNTER — Encounter (HOSPITAL_BASED_OUTPATIENT_CLINIC_OR_DEPARTMENT_OTHER): Payer: Self-pay | Admitting: Urology

## 2023-10-11 ENCOUNTER — Emergency Department (HOSPITAL_BASED_OUTPATIENT_CLINIC_OR_DEPARTMENT_OTHER)
Admission: EM | Admit: 2023-10-11 | Discharge: 2023-10-11 | Disposition: A | Discharge status: Home / Self Care | Payer: BC Managed Care – PPO | Attending: Emergency Medicine | Admitting: Emergency Medicine

## 2023-10-11 DIAGNOSIS — O4690 Antepartum hemorrhage, unspecified, unspecified trimester: Secondary | ICD-10-CM

## 2023-10-11 DIAGNOSIS — Z7901 Long term (current) use of anticoagulants: Secondary | ICD-10-CM | POA: Insufficient documentation

## 2023-10-11 DIAGNOSIS — Z3A08 8 weeks gestation of pregnancy: Secondary | ICD-10-CM | POA: Insufficient documentation

## 2023-10-11 DIAGNOSIS — O209 Hemorrhage in early pregnancy, unspecified: Secondary | ICD-10-CM | POA: Insufficient documentation

## 2023-10-11 LAB — COMPREHENSIVE METABOLIC PANEL
ALT: 16 U/L (ref 0–44)
AST: 18 U/L (ref 15–41)
Albumin: 3.5 g/dL (ref 3.5–5.0)
Alkaline Phosphatase: 60 U/L (ref 38–126)
Anion gap: 8 (ref 5–15)
BUN: 11 mg/dL (ref 6–20)
CO2: 23 mmol/L (ref 22–32)
Calcium: 9 mg/dL (ref 8.9–10.3)
Chloride: 104 mmol/L (ref 98–111)
Creatinine, Ser: 0.61 mg/dL (ref 0.44–1.00)
GFR, Estimated: >60 mL/min (ref >60)
Glucose, Bld: 95 mg/dL (ref 70–99)
Potassium: 4.2 mmol/L (ref 3.5–5.1)
Sodium: 135 mmol/L (ref 135–145)
Total Bilirubin: 0.3 mg/dL (ref 0.0–1.2)
Total Protein: 7.1 g/dL (ref 6.5–8.1)

## 2023-10-11 LAB — CBC
HCT: 40.4 % (ref 36.0–46.0)
Hemoglobin: 13.3 g/dL (ref 12.0–15.0)
MCH: 29.4 pg (ref 26.0–34.0)
MCHC: 32.9 g/dL (ref 30.0–36.0)
MCV: 89.2 fL (ref 80.0–100.0)
Platelets: 259 10*3/uL (ref 150–400)
RBC: 4.53 MIL/uL (ref 3.87–5.11)
RDW: 15 % (ref 11.5–15.5)
WBC: 13 10*3/uL — ABNORMAL HIGH (ref 4.0–10.5)
nRBC: 0 % (ref 0.0–0.2)

## 2023-10-11 LAB — URINALYSIS, ROUTINE W REFLEX MICROSCOPIC
Bilirubin Urine: NEGATIVE
Glucose, UA: NEGATIVE mg/dL
Ketones, ur: NEGATIVE mg/dL
Leukocytes,Ua: NEGATIVE
Nitrite: NEGATIVE
Protein, ur: NEGATIVE mg/dL
Specific Gravity, Urine: 1.01 (ref 1.005–1.030)
pH: 6.5 (ref 5.0–8.0)

## 2023-10-11 LAB — URINALYSIS, MICROSCOPIC (REFLEX)

## 2023-10-11 LAB — HCG, QUANTITATIVE, PREGNANCY: hCG, Beta Chain, Quant, S: 142911 m[IU]/mL — ABNORMAL HIGH (ref <5)

## 2023-10-11 NOTE — ED Provider Notes (Signed)
[facility] Provider Note   CSN: [id number] Arrival date & time: 10/11/23  1758     History  Chief Complaint  Patient presents with   Vaginal Bleeding    Latoya Kennedy is a 38 y.o. female.  Patient to ED with vaginal bleeding and pelvic pain. She reports she is pregnant with LMP 12/2. Due for first [place] next week. She reports spotting with very little pelvic cramping 4 days ago that improved but recurred today with a bit more bleeding and discomfort. She states at most she is passing a small amount of blood x 2 per hour. She is not using a pad. No discharge, urinary symptoms.   The history is provided by the patient and the spouse. No language interpreter was used (Spouse in [place] speaking).  Vaginal Bleeding      Home Medications Prior to Admission medications   Medication Sig Start Date End Date Taking? Authorizing Provider  apixaban (ELIQUIS) 2.5 MG TABS tablet Take 1 tablet (2.5 mg total) by mouth 2 (two) times daily. 03/11/23   Josph Macho, MD      Allergies    Patient has no known allergies.    Review of Systems   Review of Systems  Genitourinary:  Positive for vaginal bleeding.    Physical Exam Updated Vital Signs BP 119/69   Pulse 65   Temp 99.1 F (37.3 C) (Oral)   Resp 15   Ht 5\' 11"  (1.803 m)   Wt 100.3 kg   LMP 08/12/2023   SpO2 100%   BMI 30.84 kg/m  Physical Exam Vitals and nursing note reviewed.  Constitutional:      Appearance: Normal appearance.  Eyes:     Comments: No conjunctival pallor  Cardiovascular:     Rate and Rhythm: Normal rate.  Pulmonary:     Effort: Pulmonary effort is normal.  Abdominal:     General: There is no distension.     Palpations: Abdomen is soft.     Tenderness: There is no abdominal tenderness.  Musculoskeletal:        General: Normal range of motion.  Skin:    General: Skin is warm and dry.  Neurological:     Mental Status: She is alert and oriented to  person, place, and time.     ED Results / Procedures / Treatments   Labs (all labs ordered are listed, but only abnormal results are displayed) Labs Reviewed  CBC - Abnormal; Notable for the following components:      Result Value   WBC 13.0 (*)    All other components within normal limits  HCG, QUANTITATIVE, PREGNANCY - Abnormal; Notable for the following components:   hCG, Beta Chain, Quant, S 142,911 (*)    All other components within normal limits  URINALYSIS, ROUTINE W REFLEX MICROSCOPIC - Abnormal; Notable for the following components:   APPearance HAZY (*)    Hgb urine dipstick MODERATE (*)    All other components within normal limits  URINALYSIS, MICROSCOPIC (REFLEX) - Abnormal; Notable for the following components:   Bacteria, UA RARE (*)    All other components within normal limits  COMPREHENSIVE METABOLIC PANEL  ABO/RH   Results for orders placed or performed during the hospital encounter of 10/11/23  CBC   Collection Time: 10/11/23  6:09 PM  Result Value Ref Range   WBC 13.0 (H) 4.0 - 10.5 K/uL   RBC 4.53 3.87 - 5.11 MIL/uL   Hemoglobin 13.3  12.0 - 15.0 g/dL   HCT 16.1 09.6 - 04.5 %   MCV 89.2 80.0 - 100.0 fL   MCH 29.4 26.0 - 34.0 pg   MCHC 32.9 30.0 - 36.0 g/dL   RDW 40.9 81.1 - 91.4 %   Platelets 259 150 - 400 K/uL   nRBC 0.0 0.0 - 0.2 %  Comprehensive metabolic panel   Collection Time: 10/11/23  6:09 PM  Result Value Ref Range   Sodium 135 135 - 145 mmol/L   Potassium 4.2 3.5 - 5.1 mmol/L   Chloride 104 98 - 111 mmol/L   CO2 23 22 - 32 mmol/L   Glucose, Bld 95 70 - 99 mg/dL   BUN 11 6 - 20 mg/dL   Creatinine, Ser 7.82 0.44 - 1.00 mg/dL   Calcium 9.0 8.9 - 95.6 mg/dL   Total Protein 7.1 6.5 - 8.1 g/dL   Albumin 3.5 3.5 - 5.0 g/dL   AST 18 15 - 41 U/L   ALT 16 0 - 44 U/L   Alkaline Phosphatase 60 38 - 126 U/L   Total Bilirubin 0.3 0.0 - 1.2 mg/dL   GFR, Estimated >21 >30 mL/min   Anion gap 8 5 - 15  hCG, quantitative, pregnancy   Collection  Time: 10/11/23  6:09 PM  Result Value Ref Range   hCG, Beta Chain, Quant, S 142,911 (H) <5 mIU/mL  Urinalysis, Routine w reflex microscopic -Urine, Clean Catch   Collection Time: 10/11/23  6:09 PM  Result Value Ref Range   Color, Urine YELLOW YELLOW   APPearance HAZY (A) CLEAR   Specific Gravity, Urine 1.010 1.005 - 1.030   pH 6.5 5.0 - 8.0   Glucose, UA NEGATIVE NEGATIVE mg/dL   Hgb urine dipstick MODERATE (A) NEGATIVE   Bilirubin Urine NEGATIVE NEGATIVE   Ketones, ur NEGATIVE NEGATIVE mg/dL   Protein, ur NEGATIVE NEGATIVE mg/dL   Nitrite NEGATIVE NEGATIVE   Leukocytes,Ua NEGATIVE NEGATIVE  Urinalysis, Microscopic (reflex)   Collection Time: 10/11/23  6:09 PM  Result Value Ref Range   RBC / HPF 0-5 0 - 5 RBC/hpf   WBC, UA 0-5 0 - 5 WBC/hpf   Bacteria, UA RARE (A) NONE SEEN   Squamous Epithelial / HPF 0-5 0 - 5 /HPF    EKG None  Radiology US OB Comp < 14 Wks Result Date: 10/11/2023 CLINICAL DATA:  Pregnant, vaginal bleeding, pain EXAM: OBSTETRIC <14 WK [place] AND TRANSVAGINAL OB US TECHNIQUE: Both transabdominal and transvaginal ultrasound examinations were performed for complete evaluation of the gestation as well as the maternal uterus, adnexal regions, and pelvic cul-de-sac. Transvaginal technique was performed to assess early pregnancy. COMPARISON:  None Available. FINDINGS: Intrauterine gestational sac: Single Yolk sac:  Visualized. Embryo:  Visualized. Cardiac Activity: Visualized. Heart Rate: 189 bpm CRL:  19.3 mm   8 w   3 d                  [place] EDC: 05/19/2024 Subchorionic hemorrhage:  None visualized. Maternal uterus/adnexae: Bulky fibroid uterus. IMPRESSION: 1. Single intrauterine gestation, sonographic gestational age [redacted] weeks, 3 days. Fetal heart rate 189 beats per minute. EDD 05/19/2024. 2.  Bulky fibroid uterus. Electronically Signed   By: Jearld Lesch M.D.   On: 10/11/2023 21:45   US OB Transvaginal Result Date: 10/11/2023 CLINICAL DATA:  Pregnant, vaginal bleeding, pain  EXAM: OBSTETRIC <14 WK [place] AND TRANSVAGINAL OB US TECHNIQUE: Both transabdominal and transvaginal ultrasound examinations were performed for complete evaluation of the gestation as  well as the maternal uterus, adnexal regions, and pelvic cul-de-sac. Transvaginal technique was performed to assess early pregnancy. COMPARISON:  None Available. FINDINGS: Intrauterine gestational sac: Single Yolk sac:  Visualized. Embryo:  Visualized. Cardiac Activity: Visualized. Heart Rate: 189 bpm CRL:  19.3 mm   8 w   3 d                  [place] EDC: 05/19/2024 Subchorionic hemorrhage:  None visualized. Maternal uterus/adnexae: Bulky fibroid uterus. IMPRESSION: 1. Single intrauterine gestation, sonographic gestational age [redacted] weeks, 3 days. Fetal heart rate 189 beats per minute. EDD 05/19/2024. 2.  Bulky fibroid uterus. Electronically Signed   By: Jearld Lesch M.D.   On: 10/11/2023 21:45    Procedures Procedures    Medications Ordered in ED Medications - No data to display  ED Course/ Medical Decision Making/ A&P Clinical Course as of 10/11/23 2249  Fri Oct 11, 2023  2246 Patient G1P0 having very light vaginal bleeding this week. Has established with OB care and due for 1st [place] next week.   Ultrasound today shows IUP of [redacted]w[redacted]d, fetal heart rate 189. Quant 161,096.   She can be discharged home. She has an appointment with OB next week. Return precautions for urgent OB evaluation. Advised patient and husband of [facility] as a resource.  [SU]    Clinical Course User Index [SU] Elpidio Anis, PA-C                                 Medical Decision Making Patient known pregnant, G1P0, spotting with mild pelvic cramping this week. No distress, no clots. Had been on Eliquis in the past for DVT but was taken off earlier in the year. Patient knows her blood type to be O+.     Amount and/or Complexity of Data Reviewed Labs: ordered. Radiology: ordered.           Final Clinical Impression(s) / ED  Diagnoses Final diagnoses:  Vaginal bleeding in pregnancy    Rx / DC Orders ED Discharge Orders     None         Danne Harbor 10/11/23 2249    Jacalyn Lefevre, MD 10/11/23 2352

## 2023-10-11 NOTE — ED Notes (Signed)
 Pt transported to [place]

## 2023-10-11 NOTE — ED Notes (Signed)
 D/c paperwork reviewed with pt, including follow up care.  No questions or concerns voiced at time of d/c. Latoya Kennedy Pt verbalized understanding, Ambulatory with family to ED exit, NAD.

## 2023-10-11 NOTE — ED Triage Notes (Signed)
Pt states is 8 weeks pregnancy  States spotting since Monday  Slight cramping pain    G-1, P-0  [facility], Dr. Melvyn Neth  Has appointment for next [place]

## 2023-10-11 NOTE — Discharge Instructions (Signed)
Your ultrasound shows an intrauterine pregnancy of 8 weeks and 3 days.   Follow up per scheduled visit with your OB this coming week. Call your OB if bleeding or pain becomes worse.

## 2023-10-12 LAB — ABO/RH: ABO/RH(D): O POS

## 2023-10-27 ENCOUNTER — Encounter (HOSPITAL_COMMUNITY): Payer: Self-pay | Admitting: Obstetrics & Gynecology

## 2023-10-27 ENCOUNTER — Inpatient Hospital Stay (HOSPITAL_COMMUNITY): Payer: BC Managed Care – PPO

## 2023-10-27 ENCOUNTER — Other Ambulatory Visit: Payer: Self-pay | Admitting: Obstetrics & Gynecology

## 2023-10-27 ENCOUNTER — Inpatient Hospital Stay (HOSPITAL_COMMUNITY)
Admission: AD | Admit: 2023-10-27 | Discharge: 2023-10-27 | Disposition: A | Discharge status: Home / Self Care | Payer: BC Managed Care – PPO | Attending: Obstetrics & Gynecology | Admitting: Obstetrics & Gynecology

## 2023-10-27 DIAGNOSIS — O039 Complete or unspecified spontaneous abortion without complication: Secondary | ICD-10-CM | POA: Diagnosis present

## 2023-10-27 DIAGNOSIS — O26899 Other specified pregnancy related conditions, unspecified trimester: Secondary | ICD-10-CM

## 2023-10-27 DIAGNOSIS — O209 Hemorrhage in early pregnancy, unspecified: Secondary | ICD-10-CM

## 2023-10-27 DIAGNOSIS — Z3A10 10 weeks gestation of pregnancy: Secondary | ICD-10-CM

## 2023-10-27 DIAGNOSIS — D259 Leiomyoma of uterus, unspecified: Secondary | ICD-10-CM

## 2023-10-27 LAB — COMPREHENSIVE METABOLIC PANEL
ALT: 15 U/L (ref 0–44)
AST: 16 U/L (ref 15–41)
Albumin: 2.9 g/dL — ABNORMAL LOW (ref 3.5–5.0)
Alkaline Phosphatase: 72 U/L (ref 38–126)
Anion gap: 12 (ref 5–15)
BUN: 9 mg/dL (ref 6–20)
CO2: 21 mmol/L — ABNORMAL LOW (ref 22–32)
Calcium: 9 mg/dL (ref 8.9–10.3)
Chloride: 101 mmol/L (ref 98–111)
Creatinine, Ser: 0.67 mg/dL (ref 0.44–1.00)
GFR, Estimated: >60 mL/min (ref >60)
Glucose, Bld: 101 mg/dL — ABNORMAL HIGH (ref 70–99)
Potassium: 4 mmol/L (ref 3.5–5.1)
Sodium: 134 mmol/L — ABNORMAL LOW (ref 135–145)
Total Bilirubin: 0.4 mg/dL (ref 0.0–1.2)
Total Protein: 6.8 g/dL (ref 6.5–8.1)

## 2023-10-27 LAB — CBC
HCT: 37.3 % (ref 36.0–46.0)
Hemoglobin: 12.4 g/dL (ref 12.0–15.0)
MCH: 29.5 pg (ref 26.0–34.0)
MCHC: 33.2 g/dL (ref 30.0–36.0)
MCV: 88.6 fL (ref 80.0–100.0)
Platelets: 296 10*3/uL (ref 150–400)
RBC: 4.21 MIL/uL (ref 3.87–5.11)
RDW: 14.2 % (ref 11.5–15.5)
WBC: 21.6 10*3/uL — ABNORMAL HIGH (ref 4.0–10.5)
nRBC: 0 % (ref 0.0–0.2)

## 2023-10-27 LAB — TYPE AND SCREEN
ABO/RH(D): O POS
Antibody Screen: NEGATIVE

## 2023-10-27 LAB — HCG, QUANTITATIVE, PREGNANCY: hCG, Beta Chain, Quant, S: 70404 m[IU]/mL — ABNORMAL HIGH (ref <5)

## 2023-10-27 MED ORDER — HYDROMORPHONE HCL 1 MG/ML IJ SOLN
INTRAMUSCULAR | Status: AC
  Administered 2023-10-27: 2 mg via INTRAVENOUS
  Filled 2023-10-27: qty 2

## 2023-10-27 MED ORDER — OXYCODONE-ACETAMINOPHEN 5-325 MG PO TABS: 1.0000 | ORAL_TABLET | Freq: Four times a day (QID) | ORAL | 0 refills | Status: AC | PRN

## 2023-10-27 MED ORDER — ONDANSETRON HCL 4 MG/2ML IJ SOLN
4.0000 mg | Freq: Once | INTRAMUSCULAR | Status: AC
  Administered 2023-10-27: 4 mg via INTRAVENOUS
  Filled 2023-10-27: qty 2

## 2023-10-27 MED ORDER — OXYCODONE-ACETAMINOPHEN 5-325 MG PO TABS: 2.0000 | ORAL_TABLET | Freq: Four times a day (QID) | ORAL | 0 refills | Status: DC | PRN

## 2023-10-27 MED ORDER — LACTATED RINGERS IV SOLN: INTRAVENOUS | Status: DC

## 2023-10-27 MED ORDER — HYDROMORPHONE HCL 1 MG/ML IJ SOLN: 2.0000 mg | Freq: Once | INTRAMUSCULAR | Status: AC

## 2023-10-27 MED ORDER — MISOPROSTOL 200 MCG PO TABS
800.0000 ug | ORAL_TABLET | Freq: Once | ORAL | Status: AC
  Administered 2023-10-27: 800 ug via BUCCAL
  Filled 2023-10-27: qty 4

## 2023-10-27 MED ORDER — ONDANSETRON 4 MG PO TBDP: 4.0000 mg | ORAL_TABLET | Freq: Four times a day (QID) | ORAL | 0 refills | Status: AC | PRN

## 2023-10-27 NOTE — MAU Note (Signed)
..  Latoya Kennedy is a 38 y.o. at [redacted]w[redacted]d here in MAU reporting: patient brought back directly to room d/t intensity of pain.  Patient reports vaginal bleeding for the past 5 hours and an hour ago passed what looked like products of conception.    Pain score: 11/10 Vitals:   10/27/23 0059  BP: 125/65  Pulse: (!) 57  Resp: 16  Temp: 98 F (36.7 C)  SpO2: 100%

## 2023-10-27 NOTE — MAU Provider Note (Signed)
 Chief Complaint:  Vaginal Bleeding   HPI    Latoya Kennedy is a 38 y.o. G1P0 at [redacted]w[redacted]d who presents to maternity admissions reporting S/P Miscarriage at 1900 10/26/23 with pain 10/10 on pain scale and vaginal bleeding. 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 Pain and cramping increased to level of intolerable with increased vaginal bleeding   Prior history reviewed from [facility] Seen 10/16/23 for NOB appointment and she was advised she is a high risk pregnancy d/t h/o DVT in 2021  and Multiple Large  Fibroids seen on initial NOB scan 10/16/23  Imaging Results - US OB Transvaginal (10/16/2023 9:01 AM EST) Narrative  10/16/2023 8:35 AM EST  OB ultrasound  pt declined chaperone Ultrasound performed transvaginally and transabdominally (d/t fibroids)  Findings: SIUP at 9.2 weeks with +FCA FHR = 185 bpm CRL is c/w LMP dating Uterus is enlarged with multiple fibroids seen throughout Largest fibroids include: 1. Fundal 6 x 5.4 cm 2. Posterior 5.8 x 4.6 cm 3. Posterior 4.3 x 5.5 cm 4. Mid fundal 4.6 x 3.1 cm 5. Left 5.5 x 4.2 cm 6. LUS 6.7 x 6.9 cm Ovaries appear grossly nml No free fluid  Assessment: Viable singleton intrauterine pregnancy at 9 weeks 2 days gestation with fibroid uterus        Pregnancy Course: [facility]   Past Medical History:  Diagnosis Date   Fibroid    OB History  Gravida Para Term Preterm AB Living  1       SAB IAB Ectopic Multiple Live Births          # Outcome Date GA Lbr Len/2nd Weight Sex Type Anes PTL Lv  1 Current            Past Surgical History:  Procedure Laterality Date   APPENDECTOMY     BREAST SURGERY     History reviewed. No pertinent family history. Social History   Tobacco Use   Smoking status: Never   Smokeless tobacco: Never  Vaping Use    Vaping status: Never Used  Substance Use Topics   Alcohol use: Not Currently   Drug use: Not Currently   No Known Allergies Medications Prior to Admission  Medication Sig Dispense Refill Last Dose/Taking   Prenatal Vit-Fe Fumarate-FA (MULTIVITAMIN-PRENATAL) 27-0.8 MG TABS tablet Take 1 tablet by mouth daily at 12 noon.   10/27/2023 Morning   apixaban (ELIQUIS) 2.5 MG TABS tablet Take 1 tablet (2.5 mg total) by mouth 2 (two) times daily. 60 tablet 5     I have reviewed patient's Past Medical Hx, Surgical Hx, Family Hx, Social Hx, medications and allergies.   ROS  Pertinent items noted in HPI and remainder of comprehensive ROS otherwise negative.   PHYSICAL EXAM  Patient Vitals for the past 24 hrs:  BP Temp Temp src Pulse Resp SpO2 Height Weight  10/27/23 0059 125/65 98 F (36.7 C) Axillary (!) 57 16 100 % -- --  10/27/23 0055 -- -- -- -- -- -- 5\' 11"  (1.803 m) 103.5 kg    Constitutional: Well-developed, well-nourished female in no acute distress.  Cardiovascular: normal rate & rhythm, warm and well-perfused Respiratory: normal effort, no problems with respiration noted GI: Abd soft, non-tender, non-distended MS: Extremities nontender, no edema, normal ROM Neurologic: Alert and oriented x  4.  GU: no CVA tenderness Pelvic:  Speculum exam reveals large clots in the vaginal vault (golf ball sized) manually removed with swabs and ring forceps. Cervix appears open and blood is still visibly oozing from the os, No active heavy VB observed        Labs: Results for orders placed or performed during the hospital encounter of 10/27/23 (from the past 24 hours)  CBC     Status: Abnormal   Collection Time: 10/27/23  1:04 AM  Result Value Ref Range   WBC 21.6 (H) 4.0 - 10.5 K/uL   RBC 4.21 3.87 - 5.11 MIL/uL   Hemoglobin 12.4 12.0 - 15.0 g/dL   HCT 63.0 16.0 - 10.9 %   MCV 88.6 80.0 - 100.0 fL   MCH 29.5 26.0 - 34.0 pg   MCHC 33.2 30.0 - 36.0 g/dL   RDW 32.3 55.7 - 32.2 %   Platelets  296 150 - 400 K/uL   nRBC 0.0 0.0 - 0.2 %  Comprehensive metabolic panel     Status: Abnormal   Collection Time: 10/27/23  1:04 AM  Result Value Ref Range   Sodium 134 (L) 135 - 145 mmol/L   Potassium 4.0 3.5 - 5.1 mmol/L   Chloride 101 98 - 111 mmol/L   CO2 21 (L) 22 - 32 mmol/L   Glucose, Bld 101 (H) 70 - 99 mg/dL   BUN 9 6 - 20 mg/dL   Creatinine, Ser 0.25 0.44 - 1.00 mg/dL   Calcium 9.0 8.9 - 42.7 mg/dL   Total Protein 6.8 6.5 - 8.1 g/dL   Albumin 2.9 (L) 3.5 - 5.0 g/dL   AST 16 15 - 41 U/L   ALT 15 0 - 44 U/L   Alkaline Phosphatase 72 38 - 126 U/L   Total Bilirubin 0.4 0.0 - 1.2 mg/dL   GFR, Estimated >06 >23 mL/min   Anion gap 12 5 - 15  Type and screen [facility]     Status: None   Collection Time: 10/27/23  1:04 AM  Result Value Ref Range   ABO/RH(D) O POS    Antibody Screen NEG    Sample Expiration      10/30/2023,2359 Performed at [facility], [address] [place], [place] [address]     Imaging:  US OB LESS THAN 14 WEEKS WITH OB TRANSVAGINAL Result Date: 10/27/2023 CLINICAL DATA:  Miscarriage, heavy vaginal bleeding. EXAM: OBSTETRIC <14 WK [place] AND TRANSVAGINAL OB US TECHNIQUE: Both transabdominal and transvaginal ultrasound examinations were performed for complete evaluation of the gestation as well as the maternal uterus, adnexal regions, and pelvic cul-de-sac. Transvaginal technique was performed to assess early pregnancy. COMPARISON:  10/11/2023 FINDINGS: Intrauterine gestational sac: Previously identified intrauterine gestational sac is no longer visualized. Maternal uterus/adnexae: The uterus is anteverted. The cervix is unremarkable. As noted above, no intrauterine gestational sac is visualized. There is mild heterogeneity of the endometrial stripe within lower uterine segment and the stripe itself is thickened measuring 2.4 cm thickness. Numerous submucosal, intramural, and subserosal masses are identified throughout the uterus in keeping  with multiple uterine fibroids measuring up to 10.7 x 7.8 x 8.9 cm. The uterus is lobulated and enlarged, measuring 21 cm in length. The left ovary is unremarkable. The right ovary is not visualized. No free fluid seen within the cul-de-sac. IMPRESSION: Interval abortion. Thickening of the endometrial stripe noted. Correlation with serial beta HCG examination possible follow-up sonography in 2-4 weeks may be helpful to document  complete resolution and exclude retained products of conception. Multiple uterine fibroids Electronically Signed   By: Helyn Numbers M.D.   On: 10/27/2023 02:17    MDM & MAU COURSE  MDM:   HIGH  CBC/Type/CMP/hcg Cultures obtained U/A Pain medication administered  2 mg IVP Dilaudid prior to exam with IV Zofran IVP Saline lock in place IVF OB Ultrasound to r/o retained POC  DW DR Charlotta Newton ( OB Attending @ [phone]) after ultrasound report resulted  Possible retained POC - Options counseling provided including but not limited to Misoprostol, Surgical intervention , and or expectant management with S/R/B in detail and due to patient's Medical history of prior DVT and Large Fibroid Uterus patient and husband both opted for Cytotec 800 mg buccal x 1 to be administered here and will observe if tolerated plan to discharge home with pain medications and strict bleeding precautions reviewed with patient and husband which they both verbalized understanding and are agreeable with plan as described.   PMP Aware checked  RX for Percocet and Zofran sent  Return in 1 week for f/u hcg and ultrasound to confirm complete SAB and/or earlier if needed    I have reviewed the patient chart and performed the physical exam . I have ordered & interpreted the lab results and reviewed and interpreted the sono images and consulted with Dr Charlotta Newton [facility] Attending) regarding management plan. Medications ordered as stated below.  A/P as described below.  Counseling and education provided and patient agreeable   with plan as described below. Verbalized understanding.      MAU Course: Orders Placed This Encounter  Procedures   US OB LESS THAN 14 WEEKS WITH OB TRANSVAGINAL   CBC   Comprehensive metabolic panel   hCG, quantitative, pregnancy   Type and screen [facility]   Discharge patient Discharge disposition: 01-Home or Self Care; Discharge patient date: 10/27/2023   Meds ordered this encounter  Medications   lactated ringers infusion   HYDROmorphone (DILAUDID) injection 2 mg    Refill:  0   HYDROmorphone (DILAUDID) 1 MG/ML injection    Twitty, Alondra S: cabinet override   ondansetron (ZOFRAN) injection 4 mg   misoprostol (CYTOTEC) tablet 800 mcg   oxyCODONE-acetaminophen (PERCOCET/ROXICET) 5-325 MG tablet    Sig: Take 2 tablets by mouth every 6 (six) hours as needed for up to 5 days for severe pain (pain score 7-10).    Dispense:  20 tablet    Refill:  0    Supervising Provider:   Reva Bores [[id number]]   ondansetron (ZOFRAN-ODT) 4 MG disintegrating tablet    Sig: Take 1 tablet (4 mg total) by mouth every 6 (six) hours as needed for nausea or vomiting (with pain medication).    Dispense:  20 tablet    Refill:  0    Supervising Provider:   Reva Bores [[id number]]    ASSESSMENT   1. Miscarriage   2. Vaginal bleeding affecting early pregnancy   3. Abdominal pain affecting pregnancy   4. Uterine leiomyoma, unspecified location   5. [redacted] weeks gestation of pregnancy     PLAN  Discharge home in stable condition with return precautions.  Strict bleeding precautions Return in 7-10 days for f/u Bhcg and Sono to confirm complete SAB     Allergies as of 10/27/2023   No Known Allergies      Medication List     TAKE these medications    apixaban 2.5 MG Tabs tablet Commonly  known as: Eliquis Take 1 tablet (2.5 mg total) by mouth 2 (two) times daily.   multivitamin-prenatal 27-0.8 MG Tabs tablet Take 1 tablet by mouth daily at 12 noon.   ondansetron 4 MG  disintegrating tablet Commonly known as: ZOFRAN-ODT Take 1 tablet (4 mg total) by mouth every 6 (six) hours as needed for nausea or vomiting (with pain medication).   oxyCODONE-acetaminophen 5-325 MG tablet Commonly known as: PERCOCET/ROXICET Take 2 tablets by mouth every 6 (six) hours as needed for up to 5 days for severe pain (pain score 7-10).       Marcell Barlow, MSN, [facility], [facility] [employer]

## 2024-03-17 IMAGING — US US EXTREM LOW VENOUS*R*
1 series · 13 of 24 positions shown · non-contrast
Comparison: 04/06/2021

CLINICAL DATA: Follow-up previous DVT



[Series 1: us extrem low venous*right* · 13 of 42 slices shown]
[im 1/42]
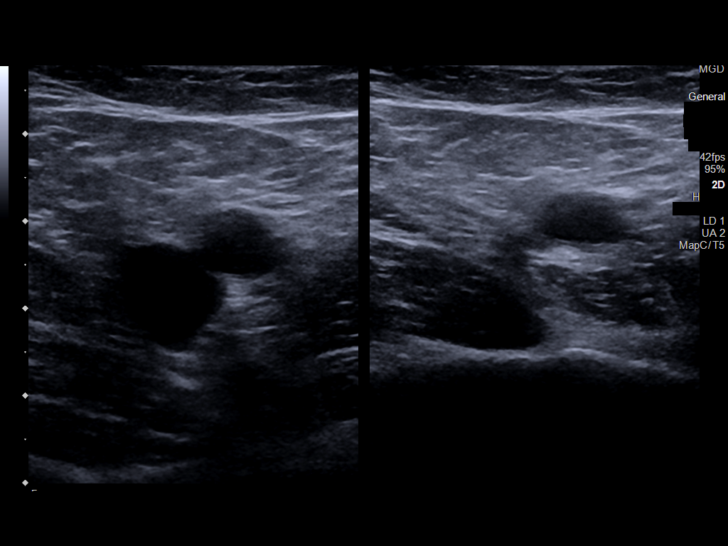
[im 4/42]
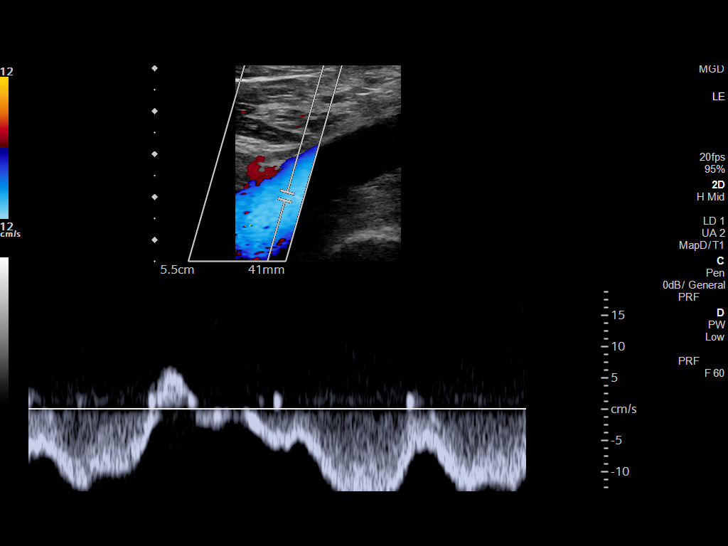
[im 8/42]
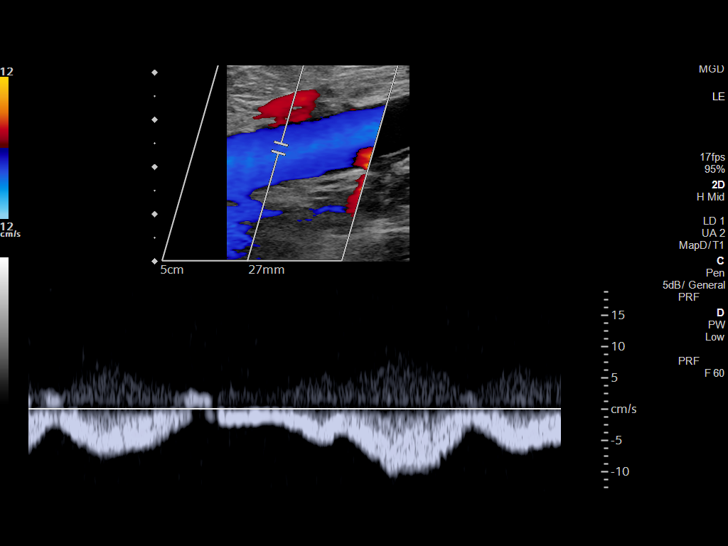
[im 11/42]
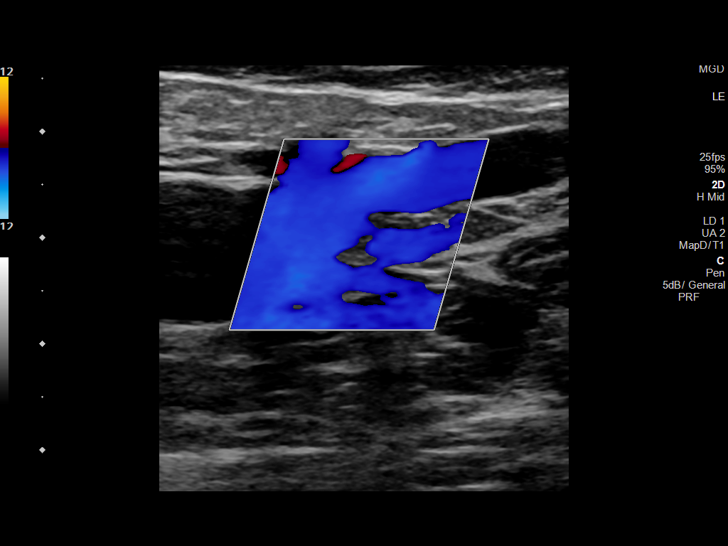
[im 15/42]
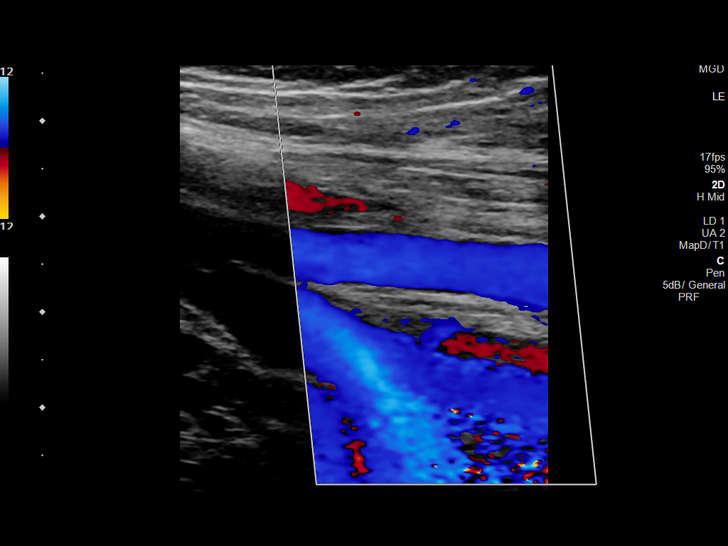
[im 18/42]
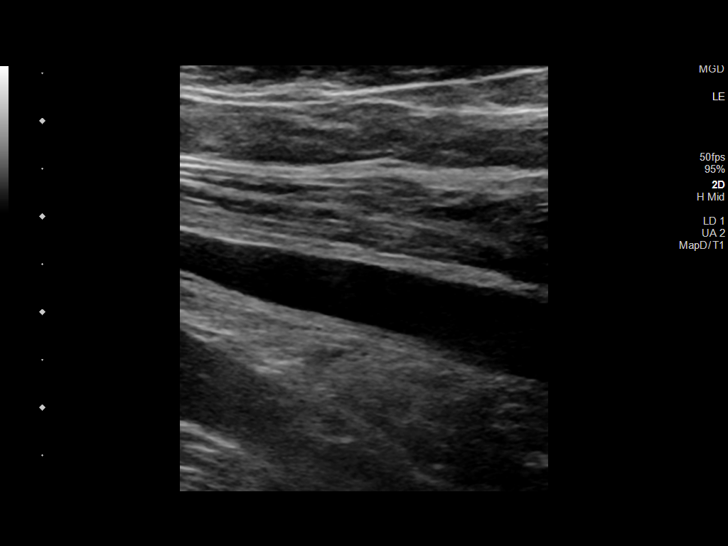
[im 22/42]
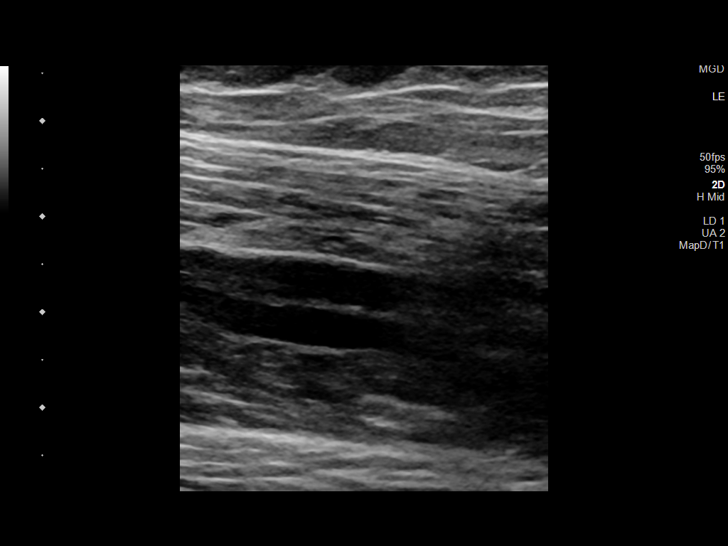
[im 24/42]
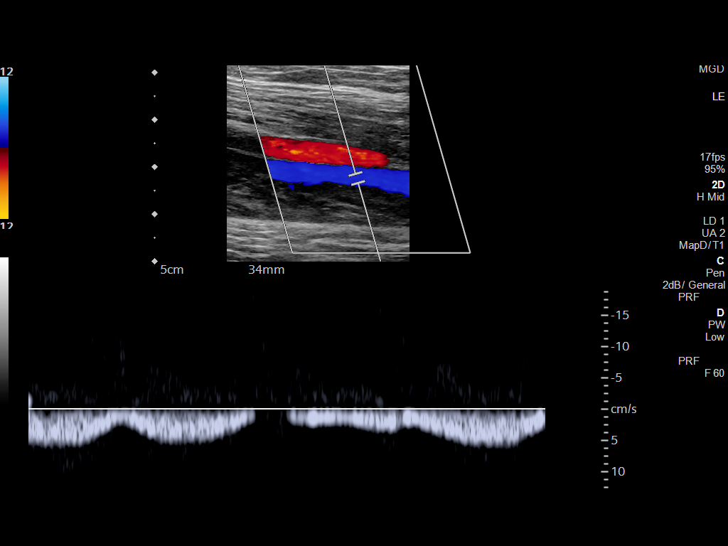
[im 27/42]
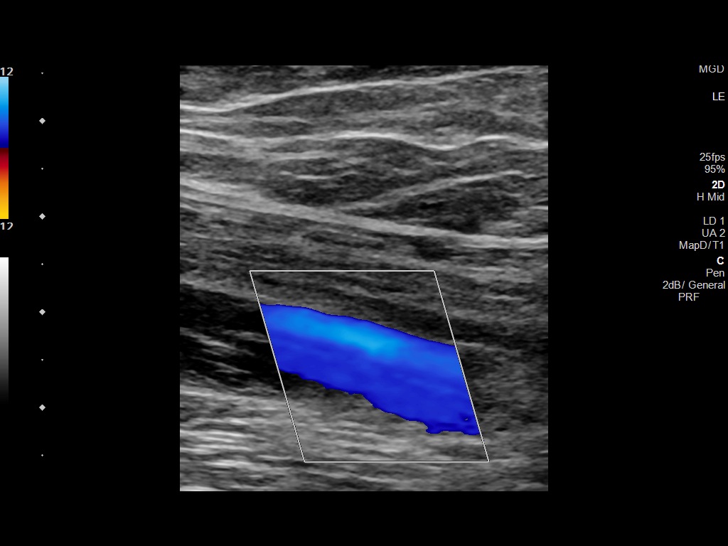
[im 31/42]
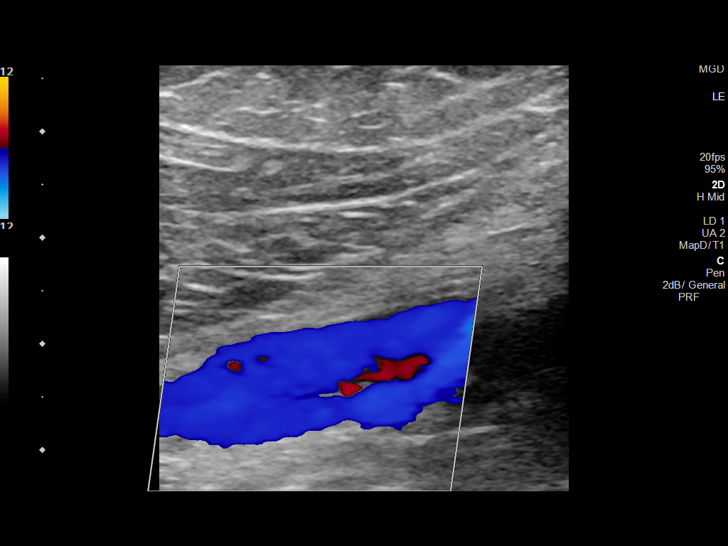
[im 34/42]
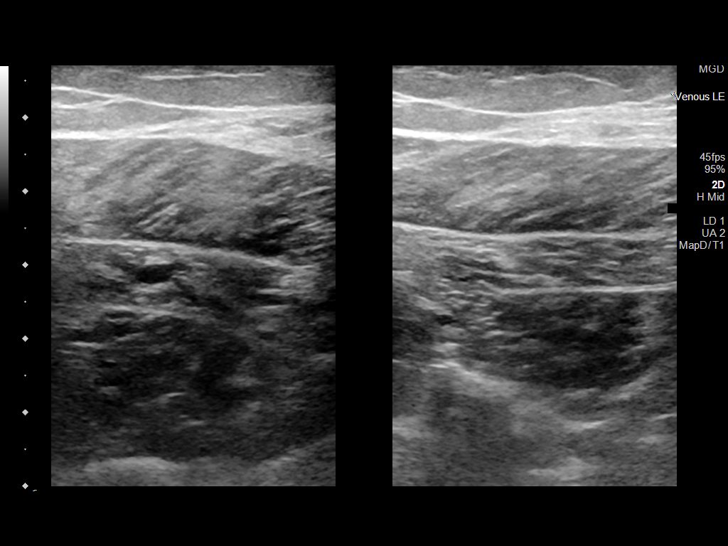
[im 38/42]
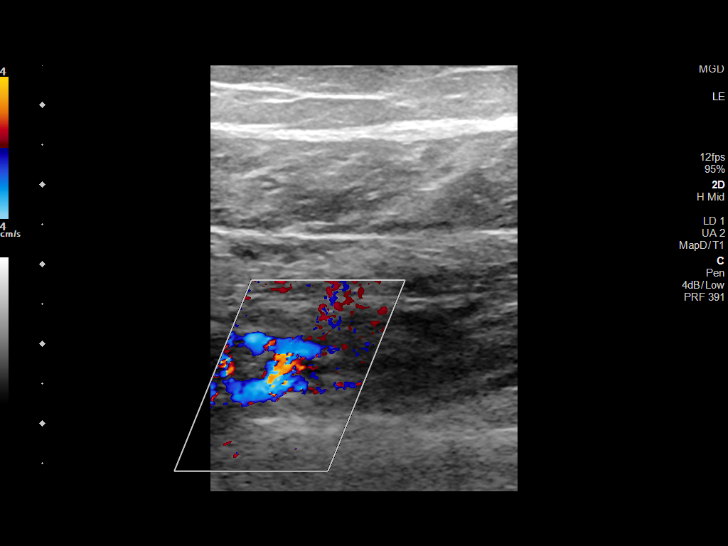
[im 42/42]
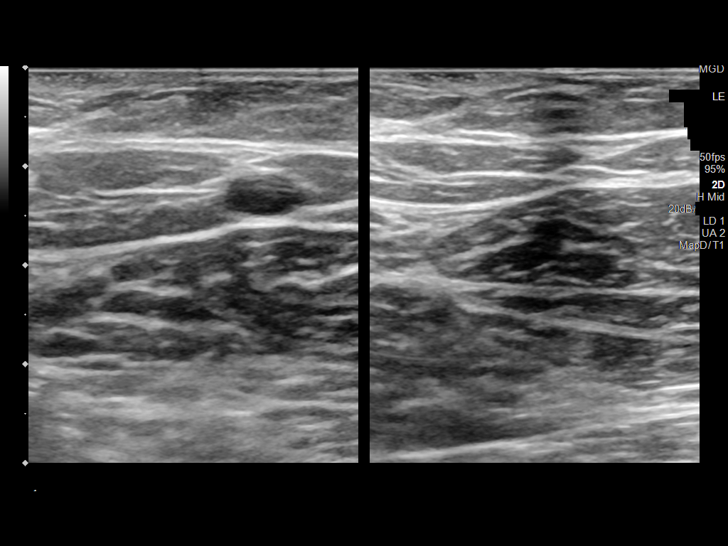

[13 of 24 positions shown; findings below may reference images not displayed]

FINDINGS: Contralateral Common Femoral Vein: Respiratory phasicity is normal
and symmetric with the symptomatic side. No evidence of thrombus.
Normal compressibility.

Common Femoral Vein: No evidence of thrombus. Normal
compressibility, respiratory phasicity and response to augmentation.

Saphenofemoral Junction: No evidence of thrombus. Normal
compressibility and flow on color Doppler imaging.

Profunda Femoral Vein: No evidence of thrombus. Normal
compressibility and flow on color Doppler imaging.

Femoral Vein: Mild nonocclusive thrombus is again identified within
the femoral vein with decreased compressibility.

Popliteal Vein: Mild residual deep venous thrombosis is noted within
the popliteal vein with decreased compressibility.

Calf Veins: No evidence of thrombus. Normal compressibility and flow
on color Doppler imaging.

Superficial Great Saphenous Vein: No evidence of thrombus. Normal
compressibility.

Venous Reflux:  None.

Other Findings:  None.
IMPRESSION: No acute deep venous thrombosis noted.

Minimal residual is noted in the femoral and popliteal veins.
